# Patient Record
Sex: Female | Born: 1950
Health system: Southern US, Community
[De-identification: ages and names within clinical notes are randomized; demographics above are authoritative.]

## PROBLEM LIST (undated history)

## (undated) DIAGNOSIS — J45909 Unspecified asthma, uncomplicated: Secondary | ICD-10-CM

## (undated) DIAGNOSIS — E785 Hyperlipidemia, unspecified: Secondary | ICD-10-CM

## (undated) DIAGNOSIS — M8580 Other specified disorders of bone density and structure, unspecified site: Secondary | ICD-10-CM

## (undated) DIAGNOSIS — Z8619 Personal history of other infectious and parasitic diseases: Secondary | ICD-10-CM

## (undated) DIAGNOSIS — G4733 Obstructive sleep apnea (adult) (pediatric): Secondary | ICD-10-CM

## (undated) DIAGNOSIS — T7840XA Allergy, unspecified, initial encounter: Secondary | ICD-10-CM

## (undated) DIAGNOSIS — G43909 Migraine, unspecified, not intractable, without status migrainosus: Secondary | ICD-10-CM

## (undated) HISTORY — PX: EYE SURGERY: SHX253

## (undated) HISTORY — DX: Obstructive sleep apnea (adult) (pediatric): G47.33

## (undated) HISTORY — DX: Personal history of other infectious and parasitic diseases: Z86.19

## (undated) HISTORY — DX: Unspecified asthma, uncomplicated: J45.909

## (undated) HISTORY — DX: Other specified disorders of bone density and structure, unspecified site: M85.80

## (undated) HISTORY — DX: Hyperlipidemia, unspecified: E78.5

## (undated) HISTORY — DX: Allergy, unspecified, initial encounter: T78.40XA

## (undated) HISTORY — DX: Migraine, unspecified, not intractable, without status migrainosus: G43.909

---

## 2005-04-07 HISTORY — PX: KNEE SURGERY: SHX244

## 2007-01-20 LAB — HM COLONOSCOPY

## 2009-05-30 ENCOUNTER — Other Ambulatory Visit: Admission: RE | Admit: 2009-05-30 | Discharge: 2009-05-30 | Payer: Self-pay | Admitting: Family Medicine

## 2010-08-05 ENCOUNTER — Other Ambulatory Visit (HOSPITAL_COMMUNITY)
Admission: RE | Admit: 2010-08-05 | Discharge: 2010-08-05 | Disposition: A | Discharge status: Home / Self Care | Payer: BC Managed Care – PPO | Source: Ambulatory Visit | Attending: Family Medicine | Admitting: Family Medicine

## 2010-08-05 ENCOUNTER — Other Ambulatory Visit: Payer: Self-pay | Admitting: Family Medicine

## 2010-08-05 DIAGNOSIS — Z01419 Encounter for gynecological examination (general) (routine) without abnormal findings: Secondary | ICD-10-CM | POA: Insufficient documentation

## 2011-07-31 ENCOUNTER — Other Ambulatory Visit: Payer: Self-pay | Admitting: Dermatology

## 2012-09-10 ENCOUNTER — Other Ambulatory Visit: Payer: Self-pay | Admitting: Family Medicine

## 2012-09-10 ENCOUNTER — Other Ambulatory Visit (HOSPITAL_COMMUNITY)
Admission: RE | Admit: 2012-09-10 | Discharge: 2012-09-10 | Disposition: A | Discharge status: Home / Self Care | Payer: BC Managed Care – PPO | Source: Ambulatory Visit | Attending: Family Medicine | Admitting: Family Medicine

## 2012-09-10 DIAGNOSIS — Z01419 Encounter for gynecological examination (general) (routine) without abnormal findings: Secondary | ICD-10-CM | POA: Insufficient documentation

## 2013-04-07 LAB — HM PAP SMEAR: HM PAP: NORMAL

## 2014-03-10 DIAGNOSIS — H6983 Other specified disorders of Eustachian tube, bilateral: Secondary | ICD-10-CM | POA: Insufficient documentation

## 2014-03-29 DIAGNOSIS — E785 Hyperlipidemia, unspecified: Secondary | ICD-10-CM | POA: Insufficient documentation

## 2015-12-26 DIAGNOSIS — R42 Dizziness and giddiness: Secondary | ICD-10-CM | POA: Insufficient documentation

## 2016-10-03 DIAGNOSIS — I4510 Unspecified right bundle-branch block: Secondary | ICD-10-CM | POA: Insufficient documentation

## 2016-10-09 LAB — HM DEXA SCAN

## 2016-10-09 LAB — HM MAMMOGRAPHY: HM MAMMO: NORMAL (ref 0–4)

## 2016-10-14 DIAGNOSIS — M8580 Other specified disorders of bone density and structure, unspecified site: Secondary | ICD-10-CM | POA: Insufficient documentation

## 2016-11-10 ENCOUNTER — Telehealth: Payer: Self-pay

## 2016-11-10 NOTE — Telephone Encounter (Signed)
SENT NOTES TO SCHEDULING 

## 2016-12-05 ENCOUNTER — Telehealth: Payer: Self-pay | Admitting: Cardiology

## 2016-12-05 NOTE — Telephone Encounter (Signed)
Received incoming records from [facility] for upcoming appointment on 12/26/16 @ 11:00am with Dr. Percival Spanish. Records given to [facility] in Medical Records. 12/05/16/ab

## 2016-12-10 ENCOUNTER — Telehealth: Payer: Self-pay | Admitting: *Deleted

## 2016-12-10 NOTE — Telephone Encounter (Signed)
Made pt aware

## 2016-12-10 NOTE — Telephone Encounter (Signed)
Pt would like to make provider aware that she had her Medicare wellness visit on 08/11/16. Pt is due to establish care with provider on 12/29/16.

## 2016-12-10 NOTE — Telephone Encounter (Signed)
Ok, can you ask pt to bring any previous health records / results with her to her upcoming appt with Melissa?

## 2016-12-25 NOTE — Progress Notes (Signed)
Cardiology Office Note   Date:  12/26/2016   ID:  Anna Peterson, DOB April 16, 1950, MRN [id number]  PCP:  Rich Fuchs, PA  Cardiologist:   Minus Breeding, MD  Referring:  Rich Fuchs, PA  Chief Complaint  Patient presents with  . Abnormal ECG      History of Present Illness: Anna Peterson is a 66 y.o. female who is referred by Rich Fuchs, PA for evaluation of an abnormal EKG.  She has an incomplete RBBB.  She has no past cardiac history.  The patient denies any new symptoms such as chest discomfort, neck or arm discomfort. There has been no new shortness of breath, PND or orthopnea. There have been no reported palpitations, presyncope or syncope.  She's had no reason for past testing. She's not sure she's had a previous EKG that demonstrates the same thing as she is originally from [place].    The abnormal EKG was noted on routine testing.   Past Medical History:  Diagnosis Date  . Allergy   . Asthma   . Hyperlipidemia   . Osteoporosis    Osteopenia per Dexa Scan    Past Surgical History:  Procedure Laterality Date  . CESAREAN SECTION    . KNEE SURGERY       Current Outpatient Prescriptions  Medication Sig Dispense Refill  . albuterol (PROVENTIL HFA;VENTOLIN HFA) 108 (90 Base) MCG/ACT inhaler Inhale 2 puffs into the lungs every 6 (six) hours as needed for wheezing or shortness of breath.    . Coenzyme Q10 (CO Q-10) 120 MG CAPS Take 120 mg by mouth 2 (two) times daily.    . fluticasone (FLONASE) 50 MCG/ACT nasal spray Place 1 spray into both nostrils daily as needed for allergies or rhinitis.    . Garlic 6759 MG CAPS Take 1 capsule by mouth daily.    Marland Kitchen loratadine (CLARITIN) 10 MG tablet Take 10 mg by mouth daily as needed for allergies.    . Multiple Vitamins-Minerals (MULTIVITAL PO) Take 1 tablet by mouth daily.    . Omega-3 Fatty Acids (FISH OIL) 1200 MG CAPS Take 1,200 mg by mouth 2 (two) times daily.    . Pseudoephedrine-Naproxen Na (SUDAFED  PRESSURE+PAIN 12 HR PO) Take 1 capsule by mouth as needed.    . Red Yeast Rice Extract (RED YEAST RICE PO) Take 1,200 mg by mouth 2 (two) times daily.    . SUMAtriptan (IMITREX) 100 MG tablet Take 100 mg by mouth every 2 (two) hours as needed for migraine. May repeat in 2 hours if headache persists or recurs.     No current facility-administered medications for this visit.     Allergies:   Other; Ceclor [cefaclor]; and Latex    Social History:  The patient  reports that she quit smoking about 46 years ago. Her smoking use included Cigarettes. She has never used smokeless tobacco.   Family History:  The patient's family history includes Aneurysm in her mother; Breast cancer in her paternal grandmother; Pancreatic cancer in her maternal grandmother; Stroke in her maternal grandfather.    ROS:  Please see the history of present illness.   Otherwise, review of systems are positive for none.   All other systems are reviewed and negative.    PHYSICAL EXAM: VS:  BP (!) 132/92   Pulse 68   Ht 5' 2.5" (1.588 m)   Wt 157 lb (71.2 kg)   BMI 28.26 kg/m  , BMI Body mass index is 28.26  kg/m. GENERAL:  Well appearing HEENT:  Pupils equal round and reactive, fundi not visualized, oral mucosa unremarkable NECK:  No jugular venous distention, waveform within normal limits, carotid upstroke brisk and symmetric, no bruits, no thyromegaly LYMPHATICS:  No cervical, inguinal adenopathy LUNGS:  Clear to auscultation bilaterally BACK:  No CVA tenderness CHEST:  Unremarkable HEART:  PMI not displaced or sustained,S1 and S2 within normal limits, no S3, no S4, no clicks, no rubs, no murmurs ABD:  Flat, positive bowel sounds normal in frequency in pitch, no bruits, no rebound, no guarding, no midline pulsatile mass, no hepatomegaly, no splenomegaly EXT:  2 plus pulses throughout, no edema, no cyanosis no clubbing SKIN:  No rashes no nodules NEURO:  Cranial nerves II through XII grossly intact, motor grossly  intact throughout PSYCH:  Cognitively intact, oriented to person place and time    EKG:  EKG is ordered today. The ekg ordered today demonstrates sinus rhythm, rate 68, left axis deviation, early transition 2, RSR prime V1, nonspecific anterior T-wave changes.   Recent Labs: No results found for requested labs within last 8760 hours.    Lipid Panel No results found for: CHOL, TRIG, HDL, CHOLHDL, VLDL, LDLCALC, LDLDIRECT    Wt Readings from Last 3 Encounters:  12/26/16 157 lb (71.2 kg)      Other studies Reviewed: Additional studies/ records that were reviewed today include: Labs. Review of the above records demonstrates:  Please see elsewhere in the note.     ASSESSMENT AND PLAN:    ABNORMAL EKG:  She has a mildly abnormal EKG without any suggestion of structural heart disease or coronary artery disease. At this point no further cardiovascular testing is suggested. She has a low cardiovascular risk factor for anemia. Approximately 2%.  DYSLIPIDEMIA:   She does have an LDL of 166 but by current criteria would not need statin therapy. We discussed lifestyle and exercise.    Current medicines are reviewed at length with the patient today.  The patient does not have concerns regarding medicines.  The following changes have been made:  no change  Labs/ tests ordered today include: None  Orders Placed This Encounter  Procedures  . EKG 12-Lead     Disposition:   FU with me as needed.      Signed, Minus Breeding, MD  12/26/2016 12:23 PM    [facility]

## 2016-12-26 ENCOUNTER — Encounter: Payer: Self-pay | Admitting: Cardiology

## 2016-12-26 ENCOUNTER — Telehealth: Payer: Self-pay

## 2016-12-26 ENCOUNTER — Ambulatory Visit (INDEPENDENT_AMBULATORY_CARE_PROVIDER_SITE_OTHER): Payer: Medicare Other | Admitting: Cardiology

## 2016-12-26 VITALS — BP 132/92 | HR 68 | Ht 62.5 in | Wt 157.0 lb

## 2016-12-26 DIAGNOSIS — I4510 Unspecified right bundle-branch block: Secondary | ICD-10-CM

## 2016-12-26 NOTE — Telephone Encounter (Signed)
Pre visit call completed 

## 2016-12-26 NOTE — Patient Instructions (Signed)
Your physician recommends that you schedule a follow-up appointment in: As Needed    

## 2016-12-29 ENCOUNTER — Ambulatory Visit (INDEPENDENT_AMBULATORY_CARE_PROVIDER_SITE_OTHER): Payer: Medicare Other | Admitting: Family

## 2016-12-29 ENCOUNTER — Encounter: Payer: Self-pay | Admitting: Family

## 2016-12-29 VITALS — BP 122/88 | HR 69 | Temp 98.6°F | Resp 16 | Ht 62.5 in | Wt 157.4 lb

## 2016-12-29 DIAGNOSIS — M25561 Pain in right knee: Secondary | ICD-10-CM | POA: Insufficient documentation

## 2016-12-29 DIAGNOSIS — M8580 Other specified disorders of bone density and structure, unspecified site: Secondary | ICD-10-CM | POA: Diagnosis not present

## 2016-12-29 DIAGNOSIS — J45909 Unspecified asthma, uncomplicated: Secondary | ICD-10-CM | POA: Diagnosis not present

## 2016-12-29 DIAGNOSIS — B351 Tinea unguium: Secondary | ICD-10-CM

## 2016-12-29 DIAGNOSIS — E785 Hyperlipidemia, unspecified: Secondary | ICD-10-CM | POA: Diagnosis not present

## 2016-12-29 DIAGNOSIS — G8929 Other chronic pain: Secondary | ICD-10-CM

## 2016-12-29 DIAGNOSIS — I4510 Unspecified right bundle-branch block: Secondary | ICD-10-CM | POA: Diagnosis not present

## 2016-12-29 LAB — LIPID PANEL
CHOLESTEROL: 225 mg/dL — AB (ref 0–200)
HDL: 54.1 mg/dL (ref >39.00)
LDL CALC: 154 mg/dL — AB (ref 0–99)
NonHDL: 170.54
Total CHOL/HDL Ratio: 4
Triglycerides: 84 mg/dL (ref 0.0–149.0)
VLDL: 16.8 mg/dL (ref 0.0–40.0)

## 2016-12-29 NOTE — Assessment & Plan Note (Signed)
Declines statin therapy, she wishes to have lipid panel checked today.

## 2016-12-29 NOTE — Assessment & Plan Note (Signed)
Discussed oral lamisil, she declines at this time.

## 2016-12-29 NOTE — Assessment & Plan Note (Signed)
She is maintained on a calcium supplemetn.

## 2016-12-29 NOTE — Assessment & Plan Note (Signed)
Sounds like mild OA. She does not wish to pursue aggressive work up/treatment. Advised pt as follows:   For knee pain you may use tylenol as needed and meloxicam sparingly for severe pain. Let me know if pain worsens and I will refer you to orthopedics.

## 2016-12-29 NOTE — Patient Instructions (Addendum)
Please complete lab work prior to leaving. For knee pain you may use tylenol as needed and meloxicam sparingly for severe pain. Let me know if pain worsens and I will refer you to orthopedics. For toenail fungus, please let me know if you would like to try oral lamisil in the future.

## 2016-12-29 NOTE — Assessment & Plan Note (Signed)
Has seasonal symptoms. Currently stable. Monitor.

## 2016-12-29 NOTE — Assessment & Plan Note (Signed)
No further work up planned per cardiology.

## 2016-12-29 NOTE — Progress Notes (Signed)
Subjective:    Patient ID: Anna Peterson, female    DOB: [birth year], 66 y.o.   MRN: [id number]  HPI   Ms.   Patient is a 66 year old female who presents today to establish care. She has several concerns:  #1 knee pain-she reports intermittent right knee pain which began after a flight to [place] in mid August of this year.  Reports that she has intermittent pain, no pain today.  Worse with walking up stairs.  Denies Calf pain/swelling or shortness of breath.    #2 skin lesion-she reports possible skin tag that irritates her bra strap.  #3 toenail problem-she is concerned that she may have a toenail fungus. She also reports a sore area on her left fourth toe. Has had an ulcer there in the past.  Past medical history is significant for the following:  Osteopenia-she had a bone density performed August of this year.  History of right bundle branch block- saw Dr. Percival Spanish and  Migraine-has history of migraine. Has seen ENT in the past and was diagnosed with vestibular migraine. She reports that migraines are often related to the weather and barometric pressure.  Cabbage is a trigger.  Typically uses sudafed, ibuprofen and tylenol. Usually helps and if not, uses imitrex which she rarely uses.    History of asthma- reports that that is usually allergy related.  Uses albuterol prn which helps.    Hyperlipidemia-not on statin, declines to take one.  Last lipid panel 2015.   Review of Systems  Constitutional: Negative for unexpected weight change.  HENT: Negative for hearing loss and rhinorrhea.   Eyes: Negative for visual disturbance.  Respiratory: Negative for cough.   Cardiovascular: Negative for leg swelling.  Gastrointestinal: Negative for blood in stool, diarrhea and nausea.  Genitourinary: Negative for dysuria and hematuria.  Musculoskeletal:       Intermittent right knee pain  Skin: Negative for rash.  Neurological: Positive for headaches.  Hematological: Negative for  adenopathy.  Psychiatric/Behavioral:       Denies depression/anxiety       Past Medical History:  Diagnosis Date  . Allergy   . Asthma   . History of chicken pox   . Hyperlipidemia   . Migraine   . Osteoporosis    Osteopenia per Dexa Scan     Social History   Social History  . Marital status: Married    Spouse name: N/A  . Number of children: N/A  . Years of education: N/A   Occupational History  . Not on file.   Social History Main Topics  . Smoking status: Former Smoker    Types: Cigarettes    Quit date: 12/06/1970  . Smokeless tobacco: Never Used  . Alcohol use 3.0 oz/week    5 Glasses of wine per week  . Drug use: No  . Sexual activity: Not on file   Other Topics Concern  . 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  Live in 4 different states.  Oldest son Corene Cornea lives locally. Second son Shanon Brow- lives in [place], daughter lives in [place], [place] lives on [address]   No pets   Married for 43 years.      Past Surgical History:  Procedure Laterality  Date  . CESAREAN SECTION    . KNEE SURGERY Left 2007   meniscus repair    Family History  Problem Relation Age of Onset  . Aneurysm Mother 44       Cerebral age 24  . Pancreatic cancer Maternal Grandmother   . Arthritis Maternal Grandmother   . Stroke Maternal Grandfather 9  . Arthritis Maternal Grandfather   . Breast cancer Paternal Grandmother 53  . Arthritis Paternal Grandmother   . Arthritis Paternal Grandfather     Allergies  Allergen Reactions  . Cabbage     "Migraine"  . Other Swelling    Peaches  . Ceclor [Cefaclor] Rash  . Latex Rash    Current Outpatient Prescriptions on File Prior to Visit  Medication Sig Dispense Refill  . albuterol (PROVENTIL HFA;VENTOLIN HFA) 108  (90 Base) MCG/ACT inhaler Inhale 2 puffs into the lungs every 6 (six) hours as needed for wheezing or shortness of breath.    . Coenzyme Q10 (CO Q-10) 120 MG CAPS Take 120 mg by mouth 2 (two) times daily.    . fluticasone (FLONASE) 50 MCG/ACT nasal spray Place 1 spray into both nostrils daily as needed for allergies or rhinitis.    . Garlic 2725 MG CAPS Take 1 capsule by mouth daily.    Marland Kitchen loratadine (CLARITIN) 10 MG tablet Take 10 mg by mouth daily as needed for allergies.    . Multiple Vitamins-Minerals (MULTIVITAL PO) Take 1 tablet by mouth daily. MNS3. (metabolic nutrition system)    . Omega-3 Fatty Acids (FISH OIL) 1200 MG CAPS Take 1,200 mg by mouth 2 (two) times daily.    . Pseudoephedrine-Naproxen Na (SUDAFED PRESSURE+PAIN 12 HR PO) Take 1 capsule by mouth as needed.    . SUMAtriptan (IMITREX) 100 MG tablet Take 100 mg by mouth every 2 (two) hours as needed for migraine. May repeat in 2 hours if headache persists or recurs.     No current facility-administered medications on file prior to visit.     BP 122/88 (BP Location: Right Arm, Cuff Size: Normal)   Pulse 69   Temp 98.6 F (37 C) (Oral)   Resp 16   Ht 5' 2.5" (1.588 m)   Wt 157 lb 6.4 oz (71.4 kg)   LMP 04/07/1997   SpO2 99%   BMI 28.33 kg/m    Objective:   Physical Exam  Constitutional: She is oriented to person, place, and time. She appears well-developed and well-nourished.  HENT:  Head: Normocephalic and atraumatic.  Right Ear: Tympanic membrane and ear canal normal.  Left Ear: Tympanic membrane and ear canal normal.  Mouth/Throat: No oropharyngeal exudate, posterior oropharyngeal edema or posterior oropharyngeal erythema.  Eyes: No scleral icterus.  Cardiovascular: Normal rate, regular rhythm and normal heart sounds.   No murmur heard. Pulmonary/Chest: Effort normal and breath sounds normal. No respiratory distress. She has no wheezes.  Abdominal: Soft. Bowel sounds are normal.  Musculoskeletal: She exhibits no  edema.  Right knee without swelling/tenderness  Lymphadenopathy:    She has no cervical adenopathy.  Neurological: She is alert and oriented to person, place, and time.  Skin: Skin is warm and dry.  Thickened toenails bilaterally. Very small skin tag left anterior chest wall.   Psychiatric: She has a normal mood and affect. Her behavior is normal. Judgment and thought content normal.          Assessment & Plan:

## 2016-12-30 ENCOUNTER — Encounter: Payer: Self-pay | Admitting: Family

## 2017-01-02 ENCOUNTER — Telehealth: Payer: Self-pay | Admitting: Family

## 2017-01-02 NOTE — Telephone Encounter (Signed)
Relation to HM:CNOB Call back [phone]   Reason for call:  Patient inquiring about lab results, please advise

## 2017-01-02 NOTE — Telephone Encounter (Signed)
Pt aware of lab results/also advised her that a letter was mailed/thx dmf

## 2017-03-10 ENCOUNTER — Other Ambulatory Visit: Payer: Self-pay | Admitting: Family

## 2017-06-26 ENCOUNTER — Ambulatory Visit (INDEPENDENT_AMBULATORY_CARE_PROVIDER_SITE_OTHER): Payer: Medicare Other | Admitting: Family

## 2017-06-26 ENCOUNTER — Encounter: Payer: Self-pay | Admitting: Family

## 2017-06-26 VITALS — BP 144/94 | HR 73 | Temp 97.8°F | Resp 16 | Ht 62.5 in | Wt 156.0 lb

## 2017-06-26 DIAGNOSIS — G8929 Other chronic pain: Secondary | ICD-10-CM

## 2017-06-26 DIAGNOSIS — M25561 Pain in right knee: Secondary | ICD-10-CM

## 2017-06-26 DIAGNOSIS — R030 Elevated blood-pressure reading, without diagnosis of hypertension: Secondary | ICD-10-CM

## 2017-06-26 NOTE — Patient Instructions (Signed)
You will be contacted about your referral to the orthopedist.

## 2017-06-26 NOTE — Progress Notes (Signed)
Subjective:    Patient ID: Anna Peterson, female    DOB: March 03, 1951, 67 y.o.   MRN: [id number]  HPI  Patient is a 67 yr old female who presents today to discuss right knee pain. Took meloxicam the last 2 nights. This helped some.  Reports that pain has been waking her up. Pain is a dull ache.   Review of Systems See HPI  Past Medical History:  Diagnosis Date  . Allergy   . Asthma   . History of chicken pox   . Hyperlipidemia   . Migraine   . Osteopenia    Osteopenia per Dexa Scan     Social History   Socioeconomic History  . Marital status: Married    Spouse name: Not on file  . Number of children: Not on file  . Years of education: Not on file  . Highest education level: Not on file  Occupational History  . Not on file  Social Needs  . Financial resource strain: Not on file  . Food insecurity:    Worry: Not on file    Inability: Not on file  . Transportation needs:    Medical: Not on file    Non-medical: Not on file  Tobacco Use  . Smoking status: Former Smoker    Types: Cigarettes    Last attempt to quit: 12/06/1970    Years since quitting: 46.5  . Smokeless tobacco: Never Used  Substance and Sexual Activity  . Alcohol use: Yes    Alcohol/week: 3.0 oz    Types: 5 Glasses of wine per week  . Drug use: No  . Sexual activity: Not on file  Lifestyle  . Physical activity:    Days per week: Not on file    Minutes per session: Not on file  . Stress: Not on file  Relationships  . Social connections:    Talks on phone: Not on file    Gets together: Not on file    Attends religious service: Not on file    Active member of club or organization: Not on file    Attends meetings of clubs or organizations: Not on file    Relationship status: Not on file  . Intimate partner violence:    Fear of current or ex partner: Not on file    Emotionally abused: Not on file    Physically abused: Not on file    Forced sexual activity: Not on file  Other Topics Concern  .  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  Live in 4 different states.  Oldest son Corene Cornea lives locally. Second son Shanon Brow- lives in [place], daughter lives in [place], [place] lives on [address]   No pets   Married for 43 years.      Past Surgical History:  Procedure Laterality Date  . CESAREAN SECTION    . KNEE SURGERY Left 2007   meniscus repair    Family History  Problem Relation Age of Onset  . Aneurysm Mother 64       Cerebral age 7  . Pancreatic cancer Maternal Grandmother   . Arthritis Maternal Grandmother   . Stroke Maternal Grandfather 47  . Arthritis Maternal Grandfather   . Breast cancer Paternal Grandmother  33  . Arthritis Paternal Grandmother   . Arthritis Paternal Grandfather     Allergies  Allergen Reactions  . Cabbage     "Migraine"  . Other Swelling    Peaches  . Ceclor [Cefaclor] Rash  . Latex Rash    Current Outpatient Medications on File Prior to Visit  Medication Sig Dispense Refill  . albuterol (PROVENTIL HFA;VENTOLIN HFA) 108 (90 Base) MCG/ACT inhaler INHALE TWO PUFFS INTO THE LUNGS EVERY 6 (SIX) HOURS AS NEEDED FOR WHEEZING. 8.5 Inhaler 2  . fluticasone (FLONASE) 50 MCG/ACT nasal spray Place 1 spray into both nostrils daily as needed for allergies or rhinitis.    . Garlic 1791 MG CAPS Take 1 capsule by mouth daily.    Marland Kitchen loratadine (CLARITIN) 10 MG tablet Take 10 mg by mouth daily as needed for allergies.    . Multiple Vitamins-Minerals (MULTIVITAL PO) Take 1 tablet by mouth daily. MNS3. (metabolic nutrition system)    . Omega-3 Fatty Acids (FISH OIL) 1200 MG CAPS Take 1,200 mg by mouth 2 (two) times daily.    . Pseudoephedrine-Naproxen Na (SUDAFED PRESSURE+PAIN 12 HR PO) Take 1 capsule by mouth as needed.    . SUMAtriptan (IMITREX) 100  MG tablet Take 100 mg by mouth every 2 (two) hours as needed for migraine. May repeat in 2 hours if headache persists or recurs.     No current facility-administered medications on file prior to visit.     BP (!) 144/94 (BP Location: Right Arm, Patient Position: Sitting, Cuff Size: Small)   Pulse 73   Temp 97.8 F (36.6 C) (Oral)   Resp 16   Ht 5' 2.5" (1.588 m)   Wt 156 lb (70.8 kg)   LMP 04/07/1997   SpO2 100%   BMI 28.08 kg/m       Objective:   Physical Exam  Constitutional: She is oriented to person, place, and time. She appears well-developed and well-nourished.  Cardiovascular: Normal rate, regular rhythm and normal heart sounds.  No murmur heard. Pulmonary/Chest: Effort normal and breath sounds normal. No respiratory distress. She has no wheezes.  Musculoskeletal:  Bilateral knees without swelling.  Bilateral crepitus is noted R>L.   Neurological: She is alert and oriented to person, place, and time.  Psychiatric: She has a normal mood and affect. Her behavior is normal. Judgment and thought content normal.          Assessment & Plan:  Right knee pain- continue prn meloxicam, refer to orthopedics for further evaluation.   Elevated blood pressure reading- repeat BP 142/90.  Plan to repeat bp in 3 months.   BP Readings from Last 3 Encounters:  06/26/17 (!) 144/94  12/29/16 122/88  12/26/16 (!) 132/92

## 2017-09-28 ENCOUNTER — Encounter: Payer: Self-pay | Admitting: Family

## 2017-09-28 ENCOUNTER — Ambulatory Visit (INDEPENDENT_AMBULATORY_CARE_PROVIDER_SITE_OTHER): Payer: Medicare Other | Admitting: Family

## 2017-09-28 ENCOUNTER — Other Ambulatory Visit (HOSPITAL_COMMUNITY)
Admission: RE | Admit: 2017-09-28 | Discharge: 2017-09-28 | Disposition: A | Discharge status: Home / Self Care | Payer: Medicare Other | Source: Ambulatory Visit | Attending: Family | Admitting: Family

## 2017-09-28 VITALS — BP 126/67 | HR 73 | Temp 98.4°F | Resp 16 | Ht 62.5 in | Wt 148.2 lb

## 2017-09-28 DIAGNOSIS — E785 Hyperlipidemia, unspecified: Secondary | ICD-10-CM | POA: Diagnosis not present

## 2017-09-28 DIAGNOSIS — Z0000 Encounter for general adult medical examination without abnormal findings: Secondary | ICD-10-CM | POA: Diagnosis not present

## 2017-09-28 DIAGNOSIS — Z01419 Encounter for gynecological examination (general) (routine) without abnormal findings: Secondary | ICD-10-CM | POA: Insufficient documentation

## 2017-09-28 DIAGNOSIS — G4700 Insomnia, unspecified: Secondary | ICD-10-CM

## 2017-09-28 DIAGNOSIS — R400 Somnolence: Secondary | ICD-10-CM | POA: Diagnosis not present

## 2017-09-28 LAB — COMPREHENSIVE METABOLIC PANEL
ALT: 24 U/L (ref 0–35)
AST: 21 U/L (ref 0–37)
Albumin: 4.4 g/dL (ref 3.5–5.2)
Alkaline Phosphatase: 54 U/L (ref 39–117)
BUN: 14 mg/dL (ref 6–23)
CO2: 27 meq/L (ref 19–32)
Calcium: 9.8 mg/dL (ref 8.4–10.5)
Chloride: 104 mEq/L (ref 96–112)
Creatinine, Ser: 0.74 mg/dL (ref 0.40–1.20)
GFR: 83.41 mL/min (ref >60.00)
GLUCOSE: 98 mg/dL (ref 70–99)
Potassium: 4.1 mEq/L (ref 3.5–5.1)
Sodium: 139 mEq/L (ref 135–145)
TOTAL PROTEIN: 6.9 g/dL (ref 6.0–8.3)
Total Bilirubin: 0.6 mg/dL (ref 0.2–1.2)

## 2017-09-28 LAB — LIPID PANEL
CHOLESTEROL: 236 mg/dL — AB (ref 0–200)
HDL: 55.5 mg/dL (ref >39.00)
LDL CALC: 164 mg/dL — AB (ref 0–99)
NonHDL: 180.21
TRIGLYCERIDES: 83 mg/dL (ref 0.0–149.0)
Total CHOL/HDL Ratio: 4
VLDL: 16.6 mg/dL (ref 0.0–40.0)

## 2017-09-28 NOTE — Patient Instructions (Signed)
Please complete lab work prior to leaving.   

## 2017-09-28 NOTE — Progress Notes (Signed)
Subjective:    Patient ID: Anna Peterson, female    DOB: [birth year], 67 y.o.   MRN: [id number]  HPI  Patient presents today for complete physical.  Immunizations: declines pneumovax, tetanus up to date Diet: healthy Exercise:  Some walking, limited by knee pain Colonoscopy:  2018 Dexa:  7/18 Pap Smear: due Mammogram: 10/09/16 Vision: 08/05/17 Dental: up to date Wt Readings from Last 3 Encounters:  09/28/17 148 lb 3.2 oz (67.2 kg)  06/26/17 156 lb (70.8 kg)  12/29/16 157 lb 6.4 oz (71.4 kg)    Reports that she wakes up frequently during the night. + snoring, often wakes up unrested.  Insomnia- reports some trouble falling asleep and some trouble staying asleep. Reports + snoring and also some daytime somnolence. Concerned about the possibility of OSA.  Review of Systems  HENT: Negative for rhinorrhea.   Eyes: Negative for visual disturbance.  Respiratory: Negative for cough and shortness of breath.   Cardiovascular: Negative for chest pain and leg swelling.  Gastrointestinal: Negative for blood in stool, constipation and diarrhea.  Genitourinary: Negative for dysuria, frequency and hematuria.  Musculoskeletal: Negative for myalgias.       Some knee pain- sees ortho   Skin: Negative for rash.  Neurological: Positive for headaches.       Reports "barometric headaches" if the weather is going to change. Uses sudafed, ibuprofen, rarely needs to use imitrex  Hematological: Negative for adenopathy.  Psychiatric/Behavioral:       Denies depression/anxiety       Past Medical History:  Diagnosis Date  . Allergy   . Asthma   . History of chicken pox   . Hyperlipidemia   . Migraine   . Osteopenia    Osteopenia per Dexa Scan     Social History   Socioeconomic History  . Marital status: Married    Spouse name: Not on file  . Number of children: Not on file  . Years of education: Not on file  . Highest education level: Not on file  Occupational History  . Not on file    Social Needs  . Financial resource strain: Not on file  . Food insecurity:    Worry: Not on file    Inability: Not on file  . Transportation needs:    Medical: Not on file    Non-medical: Not on file  Tobacco Use  . Smoking status: Former Smoker    Types: Cigarettes    Last attempt to quit: 12/06/1970    Years since quitting: 46.8  . Smokeless tobacco: Never Used  Substance and Sexual Activity  . Alcohol use: Yes    Alcohol/week: 3.0 oz    Types: 5 Glasses of wine per week  . Drug use: No  . Sexual activity: Not on file  Lifestyle  . Physical activity:    Days per week: Not on file    Minutes per session: Not on file  . Stress: Not on file  Relationships  . Social connections:    Talks on phone: Not on file    Gets together: Not on file    Attends religious service: Not on file    Active member of club or organization: Not on file    Attends meetings of clubs or organizations: Not on file    Relationship status: Not on file  . Intimate partner violence:    Fear of current or ex partner: Not on file    Emotionally abused: Not on file  Physically abused: Not on file    Forced sexual activity: Not on file  Other Topics Concern  . 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  Live in 4 different states.  Oldest son Corene Cornea lives locally. Second son Shanon Brow- lives in [place], daughter lives in [place], [place] lives on [address]   No pets   Married for 43 years.      Past Surgical History:  Procedure Laterality Date  . CESAREAN SECTION    . KNEE SURGERY Left 2007   meniscus repair    Family History  Problem Relation Age of Onset  . Aneurysm Mother 74       Cerebral age 79  . Pancreatic cancer Maternal Grandmother   . Arthritis Maternal Grandmother   .  Stroke Maternal Grandfather 48  . Arthritis Maternal Grandfather   . Breast cancer Paternal Grandmother 35  . Arthritis Paternal Grandmother   . Arthritis Paternal Grandfather     Allergies  Allergen Reactions  . Cabbage     "Migraine"  . Other Swelling    Peaches  . Ceclor [Cefaclor] Rash  . Latex Rash    Current Outpatient Medications on File Prior to Visit  Medication Sig Dispense Refill  . albuterol (PROVENTIL HFA;VENTOLIN HFA) 108 (90 Base) MCG/ACT inhaler INHALE TWO PUFFS INTO THE LUNGS EVERY 6 (SIX) HOURS AS NEEDED FOR WHEEZING. 8.5 Inhaler 2  . fluticasone (FLONASE) 50 MCG/ACT nasal spray Place 1 spray into both nostrils daily as needed for allergies or rhinitis.    . Garlic 9476 MG CAPS Take 1 capsule by mouth daily.    Marland Kitchen loratadine (CLARITIN) 10 MG tablet Take 10 mg by mouth daily as needed for allergies.    . Multiple Vitamins-Minerals (MULTIVITAL PO) Take 1 tablet by mouth daily. MNS3. (metabolic nutrition system)    . Omega-3 Fatty Acids (FISH OIL) 1200 MG CAPS Take 1,200 mg by mouth 2 (two) times daily.    . SUMAtriptan (IMITREX) 100 MG tablet Take 100 mg by mouth every 2 (two) hours as needed for migraine. May repeat in 2 hours if headache persists or recurs.     No current facility-administered medications on file prior to visit.     BP 126/67 (BP Location: Right Arm, Patient Position: Sitting, Cuff Size: Small)   Pulse 73   Temp 98.4 F (36.9 C) (Oral)   Resp 16   Ht 5' 2.5" (1.588 m)   Wt 148 lb 3.2 oz (67.2 kg)   LMP 04/07/1997   SpO2 97%   BMI 26.67 kg/m    Objective:   Physical Exam  Physical Exam  Constitutional: She is oriented to person, place, and time. She appears well-developed and well-nourished. No distress.  HENT:  Head: Normocephalic and atraumatic.  Right Ear: Tympanic membrane and ear canal normal.  Left Ear: Tympanic membrane and ear canal normal.  Mouth/Throat: Oropharynx is clear and moist.  Eyes: Pupils are equal, round, and  reactive to light. No scleral icterus.  Neck: Normal range of motion. No thyromegaly present.  Cardiovascular: Normal rate and regular rhythm.   No murmur heard. Pulmonary/Chest: Effort normal and breath sounds normal. No respiratory distress. He has no wheezes. She has no rales. She exhibits no tenderness.  Abdominal: Soft. Bowel sounds are normal. She exhibits no distension and no mass. There is no tenderness. There is no rebound and no guarding.  Musculoskeletal: She exhibits no edema.  Lymphadenopathy:    She has no cervical adenopathy.  Neurological: She is alert and oriented to person, place, and time. She has normal patellar reflexes. She exhibits normal muscle tone. Coordination normal.  Skin: Skin is warm and dry.  Psychiatric: She has a normal mood and affect. Her behavior is normal. Judgment and thought content normal.  Breasts: Examined lying Right: Without masses, retractions, discharge or axillary adenopathy.  Left: Without masses, retractions, discharge or axillary adenopathy.  Inguinal/mons: Normal without inguinal adenopathy  External genitalia: Normal  BUS/Urethra/Skene's glands: Normal  Bladder: Normal  Vagina: Atrophic Cervix: Normal  Uterus: normal in size, shape and contour. Midline and mobile  Adnexa/parametria:  Rt: Without masses or tenderness.  Lt: Without masses or tenderness.  Anus and perineum: Normal            Assessment & Plan:   Preventative Care- discussed healthy diet, regular exercise.  Obtain follow up Lipid panel as well as a cmet.  Declines prevnar today, but wishes to schedule at a later date. She is a candidate for shingrix but it is on Psychologist, prison and probation services. mammo and colo up to date. Pap performed today.   Daytime somnolence- will obtain a home sleep study.  Insomnia- trial of melatonin.     Assessment & Plan:

## 2017-09-29 ENCOUNTER — Telehealth: Payer: Self-pay | Admitting: Family

## 2017-09-29 LAB — CYTOLOGY - PAP
DIAGNOSIS: NEGATIVE
HPV (WINDOPATH): NOT DETECTED

## 2017-09-29 NOTE — Telephone Encounter (Signed)
Anna Peterson -- please review results and advise?

## 2017-09-29 NOTE — Telephone Encounter (Signed)
Copied from [place] [id number]. Topic: Quick Communication - See Telephone Encounter >> Sep 29, 2017  9:19 AM Bea Graff, NT wrote: CRM for notification. See Telephone encounter for: 09/29/17. Pt would like a call to discuss her lab results.

## 2017-09-29 NOTE — Telephone Encounter (Signed)
See result note.  

## 2017-09-30 NOTE — Telephone Encounter (Signed)
Anna Peterson results to patient yesterday.

## 2017-10-19 LAB — HM MAMMOGRAPHY

## 2017-10-20 DIAGNOSIS — G4733 Obstructive sleep apnea (adult) (pediatric): Secondary | ICD-10-CM | POA: Diagnosis not present

## 2017-10-21 DIAGNOSIS — G4733 Obstructive sleep apnea (adult) (pediatric): Secondary | ICD-10-CM | POA: Diagnosis not present

## 2017-10-27 ENCOUNTER — Encounter: Payer: Self-pay | Admitting: Family

## 2017-11-03 ENCOUNTER — Telehealth: Payer: Self-pay | Admitting: *Deleted

## 2017-11-03 DIAGNOSIS — R400 Somnolence: Secondary | ICD-10-CM

## 2017-11-03 DIAGNOSIS — G4733 Obstructive sleep apnea (adult) (pediatric): Secondary | ICD-10-CM

## 2017-11-03 DIAGNOSIS — R0683 Snoring: Secondary | ICD-10-CM

## 2017-11-03 NOTE — Telephone Encounter (Signed)
Received call from pulmonology office stating home sleep study order needs to be cancelled and re-entered as future so they can release the result. Previous order cancelled and new order placed.

## 2017-11-04 ENCOUNTER — Other Ambulatory Visit: Payer: Self-pay | Admitting: *Deleted

## 2017-11-04 DIAGNOSIS — G4733 Obstructive sleep apnea (adult) (pediatric): Secondary | ICD-10-CM

## 2017-11-04 DIAGNOSIS — R0683 Snoring: Secondary | ICD-10-CM

## 2017-11-04 DIAGNOSIS — R400 Somnolence: Secondary | ICD-10-CM

## 2017-11-09 NOTE — Telephone Encounter (Signed)
Patient calling - states she did her sleep study 07/16-07/17 and returned to Pulmonary on 07/18.  Pt would like results of study.  Pt can be reached at [phone].

## 2017-11-10 ENCOUNTER — Encounter: Payer: Self-pay | Admitting: *Deleted

## 2017-11-10 DIAGNOSIS — G4733 Obstructive sleep apnea (adult) (pediatric): Secondary | ICD-10-CM

## 2017-11-10 HISTORY — DX: Obstructive sleep apnea (adult) (pediatric): G47.33

## 2017-11-10 NOTE — Telephone Encounter (Signed)
Please contact patient and let her know that I reviewed her sleep study.  Sleep study shows severe sleep apnea.  I would recommend that she have a CPAP titration study in the sleep lab so they can optimize her mask and settings for home CPAP.  Order has been placed.

## 2017-11-10 NOTE — Addendum Note (Signed)
Addended by: [name] on: 11/10/2017 01:31 PM   Modules accepted: Orders

## 2017-11-12 NOTE — Telephone Encounter (Signed)
Patient advised of results and further testing needed,.

## 2017-12-11 ENCOUNTER — Telehealth: Payer: Self-pay | Admitting: *Deleted

## 2017-12-11 NOTE — Telephone Encounter (Signed)
Advised pt that she would need office visit for evaluation before an antibiotic could be prescribed. Pt states she was disappointed that she called at 8am this morning and could not get an appt for today or Monday. I offered her the Saturday clinic at [facility] and pt states she lives in [place] and did not want to travel to [place]. I recommended [facility] in [place] and pt is agreeable. Provided pt with the office address.

## 2017-12-11 NOTE — Telephone Encounter (Signed)
Copied from [place] [id number]. Topic: Quick Communication - See Telephone Encounter >> Dec 11, 2017  3:14 PM Zakiyyah, [place] wrote: Pt is needing to talk with someone about getting a z-pac because she has a cough, ears and congestoin   She has tried to schedule appt but nothing available  [facility] [address]   Best number  [phone]

## 2017-12-12 ENCOUNTER — Encounter: Payer: Self-pay | Admitting: Emergency Medicine

## 2017-12-12 ENCOUNTER — Emergency Department
Admission: EM | Admit: 2017-12-12 | Discharge: 2017-12-12 | Disposition: A | Discharge status: Home / Self Care | Payer: Medicare Other | Source: Home / Self Care | Attending: Family Medicine | Admitting: Family Medicine

## 2017-12-12 DIAGNOSIS — R05 Cough: Secondary | ICD-10-CM

## 2017-12-12 DIAGNOSIS — J069 Acute upper respiratory infection, unspecified: Secondary | ICD-10-CM

## 2017-12-12 DIAGNOSIS — R053 Chronic cough: Secondary | ICD-10-CM

## 2017-12-12 MED ORDER — AZITHROMYCIN 250 MG PO TABS: 250.0000 mg | ORAL_TABLET | Freq: Every day | ORAL | 0 refills | Status: DC

## 2017-12-12 NOTE — ED Provider Notes (Signed)
[facility]    CSN: [id number] Arrival date & time: 12/12/17  4193     History   Chief Complaint Chief Complaint  Patient presents with  . Cough    HPI Anna Peterson is a 67 y.o. female.   HPI Anna Peterson is a 67 y.o. female presenting to UC with c/o cough and congestion for about 3 weeks. Cough is mildly productive. associated nasal congestion. She has taken Sudafed with mild relief. Pt notes when she gets like this she need azithromycin. Denies fever, chills, n/v/d. No known sick contacts.    Past Medical History:  Diagnosis Date  . Allergy   . Asthma   . History of chicken pox   . Hyperlipidemia   . Migraine   . OSA (obstructive sleep apnea) 11/10/2017   Severe per home study 7/19  . OSA (obstructive sleep apnea) 11/10/2017  . Osteopenia    Osteopenia per Dexa Scan    Patient Active Problem List   Diagnosis Date Noted  . OSA (obstructive sleep apnea) 11/10/2017  . Onychomycosis 12/29/2016  . Asthma 12/29/2016  . Right knee pain 12/29/2016  . Osteopenia 10/14/2016  . Incomplete right bundle branch block 10/03/2016  . Dizziness 12/26/2015  . Hyperlipidemia 03/29/2014  . Dysfunction of both eustachian tubes 03/10/2014    Past Surgical History:  Procedure Laterality Date  . CESAREAN SECTION    . KNEE SURGERY Left 2007   meniscus repair    OB History   None      Home Medications    Prior to Admission medications   Medication Sig Start Date End Date Taking? Authorizing Provider  albuterol (PROVENTIL HFA;VENTOLIN HFA) 108 (90 Base) MCG/ACT inhaler INHALE TWO PUFFS INTO THE LUNGS EVERY 6 (SIX) HOURS AS NEEDED FOR WHEEZING. 03/11/17   [name], NP  azithromycin (ZITHROMAX) 250 MG tablet Take 1 tablet (250 mg total) by mouth daily. Take first 2 tablets together, then 1 every day until finished. 12/12/17   Noe Gens, PA-C  fluticasone (FLONASE) 50 MCG/ACT nasal spray Place 1 spray into both nostrils daily as needed for allergies or  rhinitis.    [provider]  Garlic 7902 MG CAPS Take 1 capsule by mouth daily.    [provider]  loratadine (CLARITIN) 10 MG tablet Take 10 mg by mouth daily as needed for allergies.    [provider]  Multiple Vitamins-Minerals (MULTIVITAL PO) Take 1 tablet by mouth daily. MNS3. (metabolic nutrition system)    [provider]  Omega-3 Fatty Acids (FISH OIL) 1200 MG CAPS Take 1,200 mg by mouth 2 (two) times daily.    [provider]  SUMAtriptan (IMITREX) 100 MG tablet Take 100 mg by mouth every 2 (two) hours as needed for migraine. May repeat in 2 hours if headache persists or recurs.    [provider]    Family History Family History  Problem Relation Age of Onset  . Aneurysm Mother 34       Cerebral age 50  . Pancreatic cancer Maternal Grandmother   . Arthritis Maternal Grandmother   . Stroke Maternal Grandfather 53  . Arthritis Maternal Grandfather   . Breast cancer Paternal Grandmother 39  . Arthritis Paternal Grandmother   . Arthritis Paternal Grandfather     Social History Social History   Tobacco Use  . Smoking status: Former Smoker    Types: Cigarettes    Last attempt to quit: 12/06/1970    Years since quitting: 47.0  .  Smokeless tobacco: Never Used  Substance Use Topics  . Alcohol use: Yes    Alcohol/week: 5.0 standard drinks    Types: 5 Glasses of wine per week  . Drug use: No     Allergies   Cabbage; Other; Ceclor [cefaclor]; and Latex   Review of Systems Review of Systems  Constitutional: Negative for chills and fever.  HENT: Positive for congestion, postnasal drip and sore throat ( scratchy). Negative for ear pain, trouble swallowing and voice change.   Respiratory: Positive for cough. Negative for shortness of breath.   Cardiovascular: Negative for chest pain and palpitations.  Gastrointestinal: Negative for abdominal pain, diarrhea, nausea and vomiting.  Musculoskeletal: Negative for  arthralgias, back pain and myalgias.  Skin: Negative for rash.     Physical Exam Triage Vital Signs ED Triage Vitals  Enc Vitals Group     BP 12/12/17 0942 (!) 143/92     Pulse Rate 12/12/17 0942 74     Resp --      Temp 12/12/17 0942 (!) 97.4 F (36.3 C)     Temp Source 12/12/17 0942 Oral     SpO2 12/12/17 0942 98 %     Weight 12/12/17 0943 148 lb (67.1 kg)     Height --      Head Circumference --      Peak Flow --      Pain Score 12/12/17 0943 0     Pain Loc --      Pain Edu? --      Excl. in [place]? --    No data found.  Updated Vital Signs BP (!) 143/92 (BP Location: Right Arm)   Pulse 74   Temp (!) 97.4 F (36.3 C) (Oral)   Wt 148 lb (67.1 kg)   LMP 04/07/1997   SpO2 98%   BMI 26.64 kg/m   Visual Acuity Right Eye Distance:   Left Eye Distance:   Bilateral Distance:    Right Eye Near:   Left Eye Near:    Bilateral Near:     Physical Exam  Constitutional: She is oriented to person, place, and time. She appears well-developed and well-nourished. No distress.  HENT:  Head: Normocephalic and atraumatic.  Right Ear: Tympanic membrane normal.  Left Ear: Tympanic membrane normal.  Nose: Nose normal. Right sinus exhibits no maxillary sinus tenderness and no frontal sinus tenderness. Left sinus exhibits no maxillary sinus tenderness and no frontal sinus tenderness.  Mouth/Throat: Uvula is midline, oropharynx is clear and moist and mucous membranes are normal.  Eyes: EOM are normal.  Neck: Normal range of motion. Neck supple.  Cardiovascular: Normal rate and regular rhythm.  Pulmonary/Chest: Effort normal and breath sounds normal. No stridor. No respiratory distress. She has no wheezes. She has no rales.  Musculoskeletal: Normal range of motion.  Neurological: She is alert and oriented to person, place, and time.  Skin: Skin is warm and dry. She is not diaphoretic.  Psychiatric: She has a normal mood and affect. Her behavior is normal.  Nursing note and vitals  reviewed.    UC Treatments / Results  Labs (all labs ordered are listed, but only abnormal results are displayed) Labs Reviewed - No data to display  EKG None  Radiology No results found.  Procedures Procedures (including critical care time)  Medications Ordered in UC Medications - No data to display  Initial Impression / Assessment and Plan / UC Course  I have reviewed the triage vital signs and the nursing notes.  Pertinent labs & imaging results that were available during my care of the patient were reviewed by me and considered in my medical decision making (see chart for details).     Given duration of symptoms, will tx with azithromycin Home care instructions provided.  Final Clinical Impressions(s) / UC Diagnoses   Final diagnoses:  Persistent cough for 3 weeks or longer  Upper respiratory tract infection, unspecified type     Discharge Instructions      Please take antibiotics as prescribed and be sure to complete entire course even if you start to feel better to ensure infection does not come back.  Please follow up with family medicine in 1 week if not improving.     ED Prescriptions    Medication Sig Dispense Auth. Provider   azithromycin (ZITHROMAX) 250 MG tablet Take 1 tablet (250 mg total) by mouth daily. Take first 2 tablets together, then 1 every day until finished. 6 tablet Noe Gens, PA-C     Controlled Substance Prescriptions [place] Controlled Substance Registry consulted? Not Applicable   Tyrell Antonio 12/13/17 2423

## 2017-12-12 NOTE — ED Triage Notes (Signed)
Pt c/o cough and congestion x3 weeks. Afebrile and taking sudafed.

## 2017-12-12 NOTE — Discharge Instructions (Signed)
°  Please take antibiotics as prescribed and be sure to complete entire course even if you start to feel better to ensure infection does not come back. ° °Please follow up with family medicine in 1 week if not improving.  °

## 2017-12-14 ENCOUNTER — Encounter (HOSPITAL_BASED_OUTPATIENT_CLINIC_OR_DEPARTMENT_OTHER): Payer: Medicare Other

## 2017-12-21 ENCOUNTER — Ambulatory Visit (HOSPITAL_BASED_OUTPATIENT_CLINIC_OR_DEPARTMENT_OTHER): Payer: Medicare Other | Attending: Family | Admitting: Pulmonary Disease

## 2017-12-21 VITALS — Ht 62.0 in | Wt 149.0 lb

## 2017-12-21 DIAGNOSIS — G4733 Obstructive sleep apnea (adult) (pediatric): Secondary | ICD-10-CM | POA: Insufficient documentation

## 2017-12-21 DIAGNOSIS — G4731 Primary central sleep apnea: Secondary | ICD-10-CM | POA: Diagnosis not present

## 2017-12-22 DIAGNOSIS — G4733 Obstructive sleep apnea (adult) (pediatric): Secondary | ICD-10-CM | POA: Diagnosis not present

## 2017-12-23 NOTE — Procedures (Signed)
  Patient Name: Anna Peterson, Anna Peterson Date: 12/21/2017   Gender: Female  D.O.B: Sep 30, 1950  Age (years): 67  Referring Provider: Earlie Counts  Height (inches): 62  Interpreting Physician: Kara Mead MD, ABSM  Weight (lbs): 149  RPSGT: Zadie Rhine  BMI: 27  MRN: [id number]  Neck Size: 13.00  <br> <br>  CLINICAL INFORMATION  The patient is referred for a BiPAP titration to treat sleep apnea. Date of HST: 10/2017, AHI 40/h SLEEP STUDY TECHNIQUE  As per the AASM Manual for the Scoring of Sleep and Associated Events v2.3 (April 2016) with a hypopnea requiring 4% desaturations. The channels recorded and monitored were frontal, central and occipital EEG, electrooculogram (EOG), submentalis EMG (chin), nasal and oral airflow, thoracic and abdominal wall motion, anterior tibialis EMG, snore microphone, electrocardiogram, and pulse oximetry. Bilevel positive airway pressure (BPAP) was initiated at the beginning of the study and titrated to treat sleep-disordered breathing. MEDICATIONS  Medications self-administered by patient taken the night of the study : TYLENOL ARTHRITIS RESPIRATORY PARAMETERS  Optimal IPAP Pressure (cm): 22 AHI at Optimal Pressure (/hr) 0.0  Optimal EPAP Pressure (cm): 19      Overall Minimal O2 (%): 78.0 Minimal O2 at Optimal Pressure (%): 94.0  SLEEP ARCHITECTURE  Start Time: 10:56:02 PM Stop Time: 5:05:21 AM Total Time (min): 369.3 Total Sleep Time (min): 267.5  Sleep Latency (min): 2.9 Sleep Efficiency (%): 72.4% REM Latency (min): 108.5 WASO (min): 98.9  Stage N1 (%): 12.5% Stage N2 (%): 58.3% Stage N3 (%): 10.8% Stage R (%): 18.3  Supine (%): 100.00 Arousal Index (/hr): 38.1          CARDIAC DATA  The 2 lead EKG demonstrated sinus rhythm. The mean heart rate was 57.2 beats per minute. Other EKG findings include: None.  LEG MOVEMENT DATA  The total Periodic Limb Movements of Sleep (PLMS) were 0. The PLMS index was 0.0. A PLMS index of <15 is considered  normal in adults. IMPRESSIONS  - An optimal PAP pressure was selected for this patient ( 22 / 19 cm of water) - Moderate Central Sleep Apnea was noted during this titration (CAI = 24.7/h).  - Severe oxygen desaturations were observed during this titration (min O2 = 78.0%).  - No snoring was audible during this study.  - No cardiac abnormalities were observed during this study.  - Clinically significant periodic limb movements were not noted during this study. Arousals associated with PLMs were rare. DIAGNOSIS  - Obstructive Sleep Apnea (327.23 [G47.33 ICD-10]) - Treatment emergent central sleep apnea? complex sleep apnea RECOMMENDATIONS  - Trial of auto CPAP 10-20cm with a Small size Resmed Full Face Mask AirFit F20 mask and heated humidification. - Will need follow up with sleep MD to assess download. If centrals persist inspite of 4-6 weeks of therapy, consider BiPAP with goal 22/19 cm H2O - Avoid alcohol, sedatives and other CNS depressants that may worsen sleep apnea and disrupt normal sleep architecture.  - Sleep hygiene should be reviewed to assess factors that may improve sleep quality.  - Weight management and regular exercise should be initiated or continued.  - Return to Sleep Center for re-evaluation after 4 weeks of therapy   Kara Mead MD Board Certified in [place]

## 2017-12-24 ENCOUNTER — Telehealth: Payer: Self-pay | Admitting: Family

## 2017-12-24 DIAGNOSIS — G4733 Obstructive sleep apnea (adult) (pediatric): Secondary | ICD-10-CM

## 2017-12-24 NOTE — Telephone Encounter (Signed)
Please advise pt that I am setting up cpap for her but also need her to see a sleep specialist to review her cpap/settings for her sleep apnea.

## 2017-12-30 NOTE — Telephone Encounter (Signed)
Author phoned pt. To relay Melissa's message, and pt. Was amenable to plan.

## 2018-01-25 ENCOUNTER — Telehealth: Payer: Self-pay

## 2018-01-25 NOTE — Telephone Encounter (Signed)
Copied from [place] [phone]. Topic: General - Other >> Jan 25, 2018  9:42 AM Alfredia Ferguson R wrote: Patient called in and stated that she would like a new cpap machine mask due to the one she currently has messing up her face. She is requesting the Airfit F30  CB# [phone]

## 2018-01-26 NOTE — Telephone Encounter (Signed)
Patient called her Anna Peterson, they will check with her insurance company to see if a new mask is cover. Advised patient to let them contact us if they need a new rx.

## 2018-02-02 ENCOUNTER — Encounter: Payer: Self-pay | Admitting: Pulmonary Disease

## 2018-02-02 ENCOUNTER — Ambulatory Visit (INDEPENDENT_AMBULATORY_CARE_PROVIDER_SITE_OTHER): Payer: Medicare Other | Admitting: Pulmonary Disease

## 2018-02-02 VITALS — BP 110/68 | HR 75

## 2018-02-02 DIAGNOSIS — G4733 Obstructive sleep apnea (adult) (pediatric): Secondary | ICD-10-CM | POA: Diagnosis not present

## 2018-02-02 NOTE — Patient Instructions (Addendum)
Severe OSA  Has only had machine for about a week  Suboptimal treatment at present with AHI still above 10  DME referral  Trial with a chinstrap  Elevate head of bed  I will see you back in the office in about a month with a repeat download If AHI is still not improved at that time, BiPAP treatment may be our option  Call with any concerns

## 2018-02-02 NOTE — Progress Notes (Signed)
Anna Peterson    [id number]    1950-09-11  Primary Care Physician:[name], [name], NP  Referring Physician: [name], NP [name] [name], [name] [address]  Chief complaint:   Patient with a history of obstructive sleep apnea Presence of treatment emergent central sleep apnea on recent titration Has been using CPAP  HPI:  Patient with severe obstructive sleep apnea was started on auto titrating CPAP 10-20 Has had some difficulty with mask fit, the first mask did affect the nasal bridge A current mask does have some leak, dryness of the mouth on a regular basis-I believe this is probably secondary to oral venting She is on high pressures Download of about 10 days does reveal a median pressure of 14.2, 95 percentile of 18.8 with a maximum 19.8 Epworth sleepiness of 6 Usual headaches have improved Activity is maintained,  Reformed smoker    usually goes to bed about 1030 to 11:30 PM, falls asleep in about 15 minutes Tries to get out of bed between 630 and 8 AM About 3 awakenings through the night  Occupation: No pertinent history Exposures: Smoking history: Reformed smoker, quit in 1972, did smoke heavy  Outpatient Encounter Medications as of 02/02/2018  Medication Sig  . albuterol (PROVENTIL HFA;VENTOLIN HFA) 108 (90 Base) MCG/ACT inhaler INHALE TWO PUFFS INTO THE LUNGS EVERY 6 (SIX) HOURS AS NEEDED FOR WHEEZING.  . fluticasone (FLONASE) 50 MCG/ACT nasal spray Place 1 spray into both nostrils daily as needed for allergies or rhinitis.  . Garlic 0263 MG CAPS Take 1 capsule by mouth daily.  Marland Kitchen loratadine (CLARITIN) 10 MG tablet Take 10 mg by mouth daily as needed for allergies.  . Multiple Vitamins-Minerals (MULTIVITAL PO) Take 1 tablet by mouth daily. MNS3. (metabolic nutrition system)  . Omega-3 Fatty Acids (FISH OIL) 1200 MG CAPS Take 1,200 mg by mouth 2 (two) times daily.  . SUMAtriptan (IMITREX) 100 MG tablet Take 100 mg by  mouth every 2 (two) hours as needed for migraine. May repeat in 2 hours if headache persists or recurs.  . [DISCONTINUED] azithromycin (ZITHROMAX) 250 MG tablet Take 1 tablet (250 mg total) by mouth daily. Take first 2 tablets together, then 1 every day until finished.   No facility-administered encounter medications on file as of 02/02/2018.     Allergies as of 02/02/2018 - Review Complete 02/02/2018  Allergen Reaction Noted  . Cabbage  12/29/2016  . Other Swelling 10/03/2016  . Ceclor [cefaclor] Rash 12/20/2013  . Latex Rash 12/26/2013    Past Medical History:  Diagnosis Date  . Allergy   . Asthma   . History of chicken pox   . Hyperlipidemia   . Migraine   . OSA (obstructive sleep apnea) 11/10/2017   Severe per home study 7/19  . OSA (obstructive sleep apnea) 11/10/2017  . Osteopenia    Osteopenia per Dexa Scan    Past Surgical History:  Procedure Laterality Date  . CESAREAN SECTION    . KNEE SURGERY Left 2007   meniscus repair    Family History  Problem Relation Age of Onset  . Aneurysm Mother 104       Cerebral age 16  . Pancreatic cancer Maternal Grandmother   . Arthritis Maternal Grandmother   . Stroke Maternal Grandfather 58  . Arthritis Maternal Grandfather   . Breast cancer Paternal Grandmother 22  . Arthritis Paternal Grandmother   . Arthritis Paternal Grandfather     Social History  Socioeconomic History  . Marital status: Married    Spouse name: Not on file  . Number of children: Not on file  . Years of education: Not on file  . Highest education level: Not on file  Occupational History  . Not on file  Social Needs  . Financial resource strain: Not on file  . Food insecurity:    Worry: Not on file    Inability: Not on file  . Transportation needs:    Medical: Not on file    Non-medical: Not on file  Tobacco Use  . Smoking status: Former Smoker    Types: Cigarettes    Last attempt to quit: 12/06/1970    Years since quitting: 47.1  .  Smokeless tobacco: Never Used  Substance and Sexual Activity  . Alcohol use: Yes    Alcohol/week: 5.0 standard drinks    Types: 5 Glasses of wine per week  . Drug use: No  . Sexual activity: Not on file  Lifestyle  . Physical activity:    Days per week: Not on file    Minutes per session: Not on file  . Stress: Not on file  Relationships  . Social connections:    Talks on phone: Not on file    Gets together: Not on file    Attends religious service: Not on file    Active member of club or organization: Not on file    Attends meetings of clubs or organizations: Not on file    Relationship status: Not on file  . Intimate partner violence:    Fear of current or ex partner: Not on file    Emotionally abused: Not on file    Physically abused: Not on file    Forced sexual activity: Not on file  Other Topics Concern  . 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  Live in 4 different states.  Oldest son Anna Peterson lives locally. Second son Anna Peterson- lives in [place], daughter lives in [place], Anna Peterson lives on [place]   No pets   Married for 43 years.      Review of Systems  Constitutional: Positive for fatigue.  Eyes: Negative.   Respiratory: Positive for apnea and cough.   Cardiovascular: Negative.   Gastrointestinal: Negative.   Endocrine: Negative.   Genitourinary: Negative.   Musculoskeletal: Negative.   Psychiatric/Behavioral: Positive for sleep disturbance.    Vitals:   02/02/18 1335  BP: 110/68  Pulse: 75  SpO2: 96%     Physical Exam  Constitutional: She appears well-developed and well-nourished.  HENT:  Head: Normocephalic and atraumatic.  Eyes: Pupils are equal, round, and reactive to light. Conjunctivae and EOM are normal. Right eye exhibits no  discharge. Left eye exhibits no discharge.  Neck: Normal range of motion. Neck supple. No thyromegaly present.  Cardiovascular: Normal rate and regular rhythm.  No murmur heard. Pulmonary/Chest: Breath sounds normal. No respiratory distress. She has no wheezes.  Abdominal: Soft. Bowel sounds are normal. She exhibits no distension. There is no tenderness.   Data Reviewed:  Compliance data does reveal AHI of 12.8  Assessment:  Obstructive sleep apnea with treatment emergent centrals she is on auto titrating CPAP at present between 10 and 20 We only have about  10 days of data at the present time Concerns regarding dry mouth-I believe this may be related to oral venting She is already on high pressures at the present time   Plan/Recommendations:  Elevation of the head of the bed by about 30 degrees will help  Avoid supine sleep as possible  DM a referral for initiation of a chinstrap  I will see her back in the office in about 1 month with a download  Further intervention/changes in care will depend on findings at the time I do believe elevation of 8 out of the bed may reduce her pressure and allow for better tolerance of current pressures  Encouraged to call with any significant concerns   Sherrilyn Rist MD [facility] 02/02/2018, 2:09 PM  CC: Anna Alar, NP

## 2018-02-23 ENCOUNTER — Ambulatory Visit: Payer: Medicare Other | Admitting: Family

## 2018-02-26 ENCOUNTER — Ambulatory Visit: Payer: Medicare Other | Admitting: Pulmonary Disease

## 2018-02-26 ENCOUNTER — Encounter: Payer: Self-pay | Admitting: Pulmonary Disease

## 2018-02-26 ENCOUNTER — Telehealth: Payer: Self-pay | Admitting: Pulmonary Disease

## 2018-02-26 VITALS — BP 110/82 | HR 72 | Ht 62.0 in | Wt 147.0 lb

## 2018-02-26 DIAGNOSIS — Z9989 Dependence on other enabling machines and devices: Secondary | ICD-10-CM | POA: Diagnosis not present

## 2018-02-26 DIAGNOSIS — G4733 Obstructive sleep apnea (adult) (pediatric): Secondary | ICD-10-CM | POA: Diagnosis not present

## 2018-02-26 NOTE — Progress Notes (Signed)
Anna Peterson    [id number]    February 21, 1951  Primary Care Physician:[name], [name], NP  Referring Physician: [name], NP [address] [place], [place] [address]  Chief complaint:   Patient with a history of obstructive sleep apnea Presence of treatment emergent central sleep apnea on recent titration Has been using CPAP -still adjusting to it but using it nightly  HPI:  Patient with severe obstructive sleep apnea was started on auto titrating CPAP 10-20 Has had some difficulty with mask fit, the first mask did affect the nasal bridge A current mask does have some leak, dryness of the mouth on a regular basis-I believe this is probably secondary to oral venting She did try using a chinstrap-did not tolerate this until She is on high pressures 95 percentile pressure of 18.3  Improvement in AHI from previous  The only change that she is made so far is elevation of the head of the bed which she noticed did help significantly  Epworth sleepiness of 5, compared to 6 during the last visit Usual headaches have improved Activity is maintained,  Reformed smoker    usually goes to bed about 1030 to 11:30 PM, falls asleep in about 15 minutes Tries to get out of bed between 630 and 8 AM Still wakes up a couple of times during the night  Occupation: No pertinent history Exposures: Smoking history: Reformed smoker, quit in 1972, did smoke heavy  Outpatient Encounter Medications as of 02/26/2018  Medication Sig  . albuterol (PROVENTIL HFA;VENTOLIN HFA) 108 (90 Base) MCG/ACT inhaler INHALE TWO PUFFS INTO THE LUNGS EVERY 6 (SIX) HOURS AS NEEDED FOR WHEEZING.  . fluticasone (FLONASE) 50 MCG/ACT nasal spray Place 1 spray into both nostrils daily as needed for allergies or rhinitis.  . Garlic 5003 MG CAPS Take 1 capsule by mouth daily.  Marland Kitchen loratadine (CLARITIN) 10 MG tablet Take 10 mg by mouth daily as needed for allergies.  . Multiple  Vitamins-Minerals (MULTIVITAL PO) Take 1 tablet by mouth daily. MNS3. (metabolic nutrition system)  . Omega-3 Fatty Acids (FISH OIL) 1200 MG CAPS Take 1,200 mg by mouth 2 (two) times daily.  . SUMAtriptan (IMITREX) 100 MG tablet Take 100 mg by mouth every 2 (two) hours as needed for migraine. May repeat in 2 hours if headache persists or recurs.   No facility-administered encounter medications on file as of 02/26/2018.     Allergies as of 02/26/2018 - Review Complete 02/26/2018  Allergen Reaction Noted  . Cabbage  12/29/2016  . Other Swelling 10/03/2016  . Ceclor [cefaclor] Rash 12/20/2013  . Latex Rash 12/26/2013    Past Medical History:  Diagnosis Date  . Allergy   . Asthma   . History of chicken pox   . Hyperlipidemia   . Migraine   . OSA (obstructive sleep apnea) 11/10/2017   Severe per home study 7/19  . OSA (obstructive sleep apnea) 11/10/2017  . Osteopenia    Osteopenia per Dexa Scan    Past Surgical History:  Procedure Laterality Date  . CESAREAN SECTION    . KNEE SURGERY Left 2007   meniscus repair    Family History  Problem Relation Age of Onset  . Aneurysm Mother 25       Cerebral age 65  . Pancreatic cancer Maternal Grandmother   . Arthritis Maternal Grandmother   . Stroke Maternal Grandfather 85  . Arthritis Maternal Grandfather   . Breast cancer Paternal Grandmother 61  .  Arthritis Paternal Grandmother   . Arthritis Paternal Grandfather     Social History   Socioeconomic History  . Marital status: Married    Spouse name: Not on file  . Number of children: Not on file  . Years of education: Not on file  . Highest education level: Not on file  Occupational History  . Not on file  Social Needs  . Financial resource strain: Not on file  . Food insecurity:    Worry: Not on file    Inability: Not on file  . Transportation needs:    Medical: Not on file    Non-medical: Not on file  Tobacco Use  . Smoking status: Former Smoker    Types:  Cigarettes    Last attempt to quit: 12/06/1970    Years since quitting: 47.2  . Smokeless tobacco: Never Used  Substance and Sexual Activity  . Alcohol use: Yes    Alcohol/week: 5.0 standard drinks    Types: 5 Glasses of wine per week  . Drug use: No  . Sexual activity: Not on file  Lifestyle  . Physical activity:    Days per week: Not on file    Minutes per session: Not on file  . Stress: Not on file  Relationships  . Social connections:    Talks on phone: Not on file    Gets together: Not on file    Attends religious service: Not on file    Active member of club or organization: Not on file    Attends meetings of clubs or organizations: Not on file    Relationship status: Not on file  . Intimate partner violence:    Fear of current or ex partner: Not on file    Emotionally abused: Not on file    Physically abused: Not on file    Forced sexual activity: Not on file  Other Topics Concern  . 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  Live in 4 different states.  Oldest son Corene Cornea lives locally. Second son Shanon Brow- lives in [place], daughter lives in [place], [place] lives on [address]   No pets   Married for 43 years.      Review of Systems  Constitutional: Positive for fatigue.  Eyes: Negative.   Respiratory: Positive for apnea.   Cardiovascular: Negative.   Gastrointestinal: Negative.   Genitourinary: Positive for vaginal discharge.  Psychiatric/Behavioral: Positive for sleep disturbance.  All other systems reviewed and are negative.   Vitals:   02/26/18 1017  BP: 110/82  Pulse: 72  SpO2: 99%     Physical Exam  Constitutional: She appears well-developed and well-nourished.  HENT:  Head: Normocephalic and atraumatic.  Eyes: Right eye exhibits no  discharge. Left eye exhibits no discharge.  Neck: Normal range of motion. Neck supple. No tracheal deviation present. No thyromegaly present.  Cardiovascular: Normal rate and regular rhythm.  No murmur heard. Pulmonary/Chest: Effort normal and breath sounds normal. No respiratory distress. She has no wheezes.  Abdominal: Soft. Bowel sounds are normal. She exhibits no distension. There is no tenderness.   Data Reviewed:  Compliance data does reveal AHI of 7.5, this is an improvement from 12.8 from recent visit  Assessment:  Obstructive sleep apnea with  treatment emergent centrals she is on auto titrating CPAP at present between 10 and 20 Appears to be tolerating CPAP well with excellent compliance  Concerns regarding dry mouth-I believe this may be related to oral venting -Was not able to tolerate a chinstrap  -Sleeps with the head of the bed elevated at present and this seems to be helping   Plan/Recommendations:  Continue with head of the bed elevation  Avoid supine sleep as possible  I will see her back in the office in about 4 to 5 months  Other modalities of treatment for sleep disordered breathing discussed  Encouraged to call with any significant concerns   Sherrilyn Rist MD [facility] 02/26/2018, 10:20 AM  CC: [name], NP

## 2018-02-26 NOTE — Patient Instructions (Signed)
Obstructive sleep apnea Excellent compliance with treatment with improvement in residual AHI  Continue current treatment  I will see you in about 4 to 5 months Call with significant symptoms

## 2018-03-01 NOTE — Telephone Encounter (Signed)
Error

## 2018-03-08 ENCOUNTER — Telehealth: Payer: Self-pay | Admitting: *Deleted

## 2018-03-08 NOTE — Telephone Encounter (Signed)
Received Patient Compliance Report from [facility] via [facility]; forwarded to provider/SLS 12/02

## 2018-04-29 ENCOUNTER — Ambulatory Visit (INDEPENDENT_AMBULATORY_CARE_PROVIDER_SITE_OTHER): Payer: Medicare Other | Admitting: Family Medicine

## 2018-04-29 ENCOUNTER — Ambulatory Visit (HOSPITAL_BASED_OUTPATIENT_CLINIC_OR_DEPARTMENT_OTHER)
Admission: RE | Admit: 2018-04-29 | Discharge: 2018-04-29 | Disposition: A | Discharge status: Home / Self Care | Payer: Medicare Other | Source: Ambulatory Visit | Attending: Family Medicine | Admitting: Family Medicine

## 2018-04-29 ENCOUNTER — Encounter: Payer: Self-pay | Admitting: Family Medicine

## 2018-04-29 VITALS — BP 114/80 | HR 91 | Temp 98.1°F | Resp 16 | Ht 62.0 in | Wt 150.0 lb

## 2018-04-29 DIAGNOSIS — R509 Fever, unspecified: Secondary | ICD-10-CM | POA: Diagnosis present

## 2018-04-29 DIAGNOSIS — R05 Cough: Secondary | ICD-10-CM

## 2018-04-29 DIAGNOSIS — J111 Influenza due to unidentified influenza virus with other respiratory manifestations: Secondary | ICD-10-CM

## 2018-04-29 DIAGNOSIS — J029 Acute pharyngitis, unspecified: Secondary | ICD-10-CM | POA: Diagnosis present

## 2018-04-29 DIAGNOSIS — R059 Cough, unspecified: Secondary | ICD-10-CM

## 2018-04-29 LAB — POC INFLUENZA A&B (BINAX/QUICKVUE)
INFLUENZA A, POC: NEGATIVE
Influenza B, POC: NEGATIVE

## 2018-04-29 LAB — POCT RAPID STREP A (OFFICE): RAPID STREP A SCREEN: NEGATIVE

## 2018-04-29 MED ORDER — OSELTAMIVIR PHOSPHATE 75 MG PO CAPS: 75.0000 mg | ORAL_CAPSULE | Freq: Two times a day (BID) | ORAL | 0 refills | Status: DC

## 2018-04-29 NOTE — Progress Notes (Signed)
[facility] [address], [place], [place] [address] [phone]  Date:  04/29/2018   Name:  Anna Peterson   DOB:  12-24-1950   MRN:  [id number]  PCP:  [name], NP    Chief Complaint: URI (10 days ago, fever 101 last night, headache, productive cough, nasal congestion, chills)   History of Present Illness:  Anna Peterson is a 68 y.o. very pleasant female patient who presents with the following:  Pt of Melissa here today with illness She notes that 10 days ago she developed mild cold sx- did not seem to be anything serious but yesterday she started to feel worse Last night she had a HA and checked her temp- 101 She also has a cough, ST, nasal congestion, chills No body aches  Took tylenol this am - this helped with her fever She is bringing up discolored phlegm from her chest  Her nasal mucus is clear  No vomiting or diarrhea She was recently around her grand-kids, one of whom had the flu.   She has 9 grandchildren, spread across several states  Former smoker, quit many years ago  Patient Active Problem List   Diagnosis Date Noted  . OSA (obstructive sleep apnea) 11/10/2017  . Onychomycosis 12/29/2016  . Asthma 12/29/2016  . Right knee pain 12/29/2016  . Osteopenia 10/14/2016  . Incomplete right bundle branch block 10/03/2016  . Dizziness 12/26/2015  . Hyperlipidemia 03/29/2014  . Dysfunction of both eustachian tubes 03/10/2014    Past Medical History:  Diagnosis Date  . Allergy   . Asthma   . History of chicken pox   . Hyperlipidemia   . Migraine   . OSA (obstructive sleep apnea) 11/10/2017   Severe per home study 7/19  . OSA (obstructive sleep apnea) 11/10/2017  . Osteopenia    Osteopenia per Dexa Scan    Past Surgical History:  Procedure Laterality Date  . CESAREAN SECTION    . KNEE SURGERY Left 2007   meniscus repair    Social History   Tobacco Use  . Smoking status: Former  Smoker    Types: Cigarettes    Last attempt to quit: 12/06/1970    Years since quitting: 47.4  . Smokeless tobacco: Never Used  Substance Use Topics  . Alcohol use: Yes    Alcohol/week: 5.0 standard drinks    Types: 5 Glasses of wine per week  . Drug use: No    Family History  Problem Relation Age of Onset  . Aneurysm Mother 52       Cerebral age 71  . Pancreatic cancer Maternal Grandmother   . Arthritis Maternal Grandmother   . Stroke Maternal Grandfather 28  . Arthritis Maternal Grandfather   . Breast cancer Paternal Grandmother 38  . Arthritis Paternal Grandmother   . Arthritis Paternal Grandfather     Allergies  Allergen Reactions  . Levonorgestrel-Ethinyl Estrad Other (See Comments)    Sneezing Sneezing  . Cabbage     "Migraine"  . Other Swelling    Peaches  . Ceclor [Cefaclor] Rash  . Latex Rash    Medication list has been reviewed and updated.  Current Outpatient Medications on File Prior to Visit  Medication Sig Dispense Refill  . albuterol (PROVENTIL HFA;VENTOLIN HFA) 108 (90 Base) MCG/ACT inhaler INHALE TWO PUFFS INTO THE LUNGS EVERY 6 (SIX) HOURS AS NEEDED FOR WHEEZING. 8.5 Inhaler 2  . fluticasone (FLONASE) 50 MCG/ACT nasal spray Place  1 spray into both nostrils daily as needed for allergies or rhinitis.    . Garlic 9622 MG CAPS Take 1 capsule by mouth daily.    Marland Kitchen loratadine (CLARITIN) 10 MG tablet Take 10 mg by mouth daily as needed for allergies.    . Multiple Vitamins-Minerals (MULTIVITAL PO) Take 1 tablet by mouth daily. MNS3. (metabolic nutrition system)    . Omega-3 Fatty Acids (FISH OIL) 1200 MG CAPS Take 1,200 mg by mouth 2 (two) times daily.    . SUMAtriptan (IMITREX) 100 MG tablet Take 100 mg by mouth every 2 (two) hours as needed for migraine. May repeat in 2 hours if headache persists or recurs.     No current facility-administered medications on file prior to visit.     Review of Systems:  As per HPI- otherwise negative.  No vomiting or  diarrhea, no rash Physical Examination: Vitals:   04/29/18 1044  BP: 114/80  Pulse: 91  Resp: 16  Temp: 98.1 F (36.7 C)  SpO2: 97%   Vitals:   04/29/18 1044  Weight: 150 lb (68 kg)  Height: 5\' 2"  (1.575 m)   Body mass index is 27.44 kg/m. Ideal Body Weight: Weight in (lb) to have BMI = 25: 136.4  GEN: WDWN, NAD, Non-toxic, A & O x 3, mild overweight, looks well HEENT: Atraumatic, Normocephalic. Neck supple. No masses, No LAD.  Bilateral TM wnl, oropharynx normal.  PEERL,EOMI.   Ears and Nose: No external deformity. CV: RRR, No M/G/R. No JVD. No thrill. No extra heart sounds. PULM: CTA B, no wheezes, crackles, rhonchi. No retractions. No resp. distress. No accessory muscle use. ABD: S, NT, ND EXTR: No c/c/e NEURO Normal gait.  PSYCH: Normally interactive. Conversant. Not depressed or anxious appearing.  Calm demeanor.   Results for orders placed or performed in visit on 04/29/18  POCT rapid strep A  Result Value Ref Range   Rapid Strep A Screen Negative Negative  POC Influenza A&B (Binax test)  Result Value Ref Range   Influenza A, POC Negative Negative   Influenza B, POC Negative Negative   Dg Chest 2 View  Result Date: 04/29/2018 CLINICAL DATA:  Fever and chills and cough EXAM: CHEST - 2 VIEW COMPARISON:  None. FINDINGS: The heart size and mediastinal contours are within normal limits. Both lungs are clear. The visualized skeletal structures are unremarkable. IMPRESSION: No active cardiopulmonary disease. Electronically Signed   By: Franchot Gallo M.D.   On: 04/29/2018 11:22    Assessment and Plan: Fever and chills - Plan: POC Influenza A&B (Binax test), DG Chest 2 View  Sore throat - Plan: POCT rapid strep A, DG Chest 2 View  Cough - Plan: DG Chest 2 View  Influenza - Plan: oseltamivir (TAMIFLU) 75 MG capsule  Generally healthy woman here today with typical flu symptoms.  Her rapid flu is negative, but she has no other apparent infection.  Chest x-ray is  negative.  As were at the height of flu season, we will go ahead and treat her with Tamiflu now.  Have discussed with patient, we suspect flu although her rapid test was negative.  She is asked to contact us if not feeling better in the next few days, sooner if she starts feeling worse [name] states understanding of and agreement with our plan  Signed Lamar Blinks, MD

## 2018-04-29 NOTE — Patient Instructions (Signed)
We suspect that you have the flu Take the tamiflu twice a day for 5 days  If you are not feeling better- or if you start getting worse- over the next few days please call the answering service

## 2018-05-05 ENCOUNTER — Ambulatory Visit: Payer: Self-pay

## 2018-05-05 ENCOUNTER — Encounter: Payer: Self-pay | Admitting: Medical

## 2018-05-05 ENCOUNTER — Ambulatory Visit (INDEPENDENT_AMBULATORY_CARE_PROVIDER_SITE_OTHER): Payer: Medicare Other | Admitting: Medical

## 2018-05-05 VITALS — BP 136/81 | HR 86 | Temp 98.6°F | Resp 16 | Ht 62.0 in | Wt 151.4 lb

## 2018-05-05 DIAGNOSIS — J0100 Acute maxillary sinusitis, unspecified: Secondary | ICD-10-CM

## 2018-05-05 DIAGNOSIS — J40 Bronchitis, not specified as acute or chronic: Secondary | ICD-10-CM | POA: Diagnosis not present

## 2018-05-05 MED ORDER — SUMATRIPTAN SUCCINATE 100 MG PO TABS: 100.0000 mg | ORAL_TABLET | ORAL | 1 refills | Status: DC | PRN

## 2018-05-05 MED ORDER — AZITHROMYCIN 250 MG PO TABS: ORAL_TABLET | ORAL | 0 refills | Status: DC

## 2018-05-05 NOTE — Progress Notes (Signed)
Subjective:    Patient ID: Anna Peterson, female    DOB: 1951/03/09, 68 y.o.   MRN: [id number]  HPI   Pt had flu like syndrome past week. Given flu med/tamiflu. Since then she has developed nasal congestion, sinus pressure/pain, low grade fever and some productive cough. Hx of sinus infections. Pt has avoided sudafed since her bp increases. She did use flonase.  Pt is blowing out blood tinged with mucus from her nose.  No fever, no chills or sweats.  Pt thinks sinus infection following the flu.    Review of Systems  Constitutional: Positive for fatigue. Negative for chills and fever.  HENT: Positive for congestion, sinus pressure and sinus pain.   Respiratory: Positive for cough. Negative for wheezing and stridor.   Cardiovascular: Negative for chest pain and palpitations.  Gastrointestinal: Negative for abdominal pain.  Musculoskeletal: Negative for back pain and myalgias.  Skin: Negative for rash.  Neurological: Negative for dizziness and headaches.  Hematological: Negative for adenopathy. Does not bruise/bleed easily.  Psychiatric/Behavioral: Negative for behavioral problems.    Past Medical History:  Diagnosis Date  . Allergy   . Asthma   . History of chicken pox   . Hyperlipidemia   . Migraine   . OSA (obstructive sleep apnea) 11/10/2017   Severe per home study 7/19  . OSA (obstructive sleep apnea) 11/10/2017  . Osteopenia    Osteopenia per Dexa Scan     Social History   Socioeconomic History  . Marital status: Married    Spouse name: Not on file  . Number of children: Not on file  . Years of education: Not on file  . Highest education level: Not on file  Occupational History  . Not on file  Social Needs  . Financial resource strain: Not on file  . Food insecurity:    Worry: Not on file    Inability: Not on file  . Transportation needs:    Medical: Not on file    Non-medical: Not on file  Tobacco Use  . Smoking status: Former Smoker    Types:  Cigarettes    Last attempt to quit: 12/06/1970    Years since quitting: 47.4  . Smokeless tobacco: Never Used  Substance and Sexual Activity  . Alcohol use: Yes    Alcohol/week: 5.0 standard drinks    Types: 5 Glasses of wine per week  . Drug use: No  . Sexual activity: Not on file  Lifestyle  . Physical activity:    Days per week: Not on file    Minutes per session: Not on file  . Stress: Not on file  Relationships  . Social connections:    Talks on phone: Not on file    Gets together: Not on file    Attends religious service: Not on file    Active member of club or organization: Not on file    Attends meetings of clubs or organizations: Not on file    Relationship status: Not on file  . Intimate partner violence:    Fear of current or ex partner: Not on file    Emotionally abused: Not on file    Physically abused: Not on file    Forced sexual activity: Not on file  Other Topics Concern  . 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  Live in 4 different states.  Oldest son Corene Cornea lives locally. Second son Shanon Brow- lives in [place], daughter lives in [place], Marlin lives on [place]   No pets   Married for 43 years.      Past Surgical History:  Procedure Laterality Date  . CESAREAN SECTION    . KNEE SURGERY Left 2007   meniscus repair    Family History  Problem Relation Age of Onset  . Aneurysm Mother 14       Cerebral age 30  . Pancreatic cancer Maternal Grandmother   . Arthritis Maternal Grandmother   . Stroke Maternal Grandfather 47  . Arthritis Maternal Grandfather   . Breast cancer Paternal Grandmother 65  . Arthritis Paternal Grandmother   . Arthritis Paternal Grandfather     Allergies  Allergen Reactions  . Cabbage     "Migraine"  . Other  Swelling    Peaches  . Ceclor [Cefaclor] Rash  . Latex Rash    Current Outpatient Medications on File Prior to Visit  Medication Sig Dispense Refill  . albuterol (PROVENTIL HFA;VENTOLIN HFA) 108 (90 Base) MCG/ACT inhaler INHALE TWO PUFFS INTO THE LUNGS EVERY 6 (SIX) HOURS AS NEEDED FOR WHEEZING. 8.5 Inhaler 2  . fluticasone (FLONASE) 50 MCG/ACT nasal spray Place 1 spray into both nostrils daily as needed for allergies or rhinitis.    . Garlic 2248 MG CAPS Take 1 capsule by mouth daily.    Marland Kitchen loratadine (CLARITIN) 10 MG tablet Take 10 mg by mouth daily as needed for allergies.    . Multiple Vitamins-Minerals (MULTIVITAL PO) Take 1 tablet by mouth daily. MNS3. (metabolic nutrition system)    . Omega-3 Fatty Acids (FISH OIL) 1200 MG CAPS Take 1,200 mg by mouth 2 (two) times daily.    Marland Kitchen oseltamivir (TAMIFLU) 75 MG capsule Take 1 capsule (75 mg total) by mouth 2 (two) times daily. 10 capsule 0  . SUMAtriptan (IMITREX) 100 MG tablet Take 100 mg by mouth every 2 (two) hours as needed for migraine. May repeat in 2 hours if headache persists or recurs.     No current facility-administered medications on file prior to visit.     BP 136/81   Pulse 86   Temp 98.6 F (37 C) (Oral)   Resp 16   Ht 5\' 2"  (1.575 m)   Wt 151 lb 6.4 oz (68.7 kg)   LMP 04/07/1997   SpO2 100%   BMI 27.69 kg/m       Objective:   Physical Exam  General  Mental Status - Alert. General Appearance - Well groomed. Not in acute distress.  Skin Rashes- No Rashes.  HEENT Head- Normal. Ear Auditory Canal - Left- Normal. Right - Normal.Tympanic Membrane- Left- Normal. Right- Normal. Eye Sclera/Conjunctiva- Left- Normal. Right- Normal. Nose & Sinuses Nasal Mucosa- Left-  Boggy and Congested. Right-  Boggy and  Congested.Bilateral maxillary but no frontal sinus pressure. Mouth & Throat Lips: Upper Lip- Normal: no dryness, cracking, pallor, cyanosis, or vesicular eruption. Lower Lip-Normal: no dryness, cracking,  pallor, cyanosis or vesicular eruption. Buccal Mucosa- Bilateral- No Aphthous ulcers. Oropharynx- No Discharge or Erythema. Tonsils: Characteristics- Bilateral- No Erythema or Congestion. Size/Enlargement- Bilateral- No enlargement. Discharge- bilateral-None.  Neck Neck- Supple. No Masses.   Chest and Lung Exam Auscultation: Breath Sounds:-Clear even and unlabored.  Cardiovascular Auscultation:Rythm- Regular, rate and rhythm. Murmurs & Other Heart Sounds:Ausculatation of the heart reveal- No  Murmurs.  Lymphatic Head & Neck General Head & Neck Lymphatics: Bilateral: Description- No Localized lymphadenopathy.       Assessment & Plan:    You appear to have bronchitis and sinusitis following flu like syndrome(probable secondary infections). Rest hydrate and tylenol for fever. Offered cough med but decided to use otc cough meds. Will rx azithromycin  antibiotic. For your nasal congestion you could use otc the counter nasal steroid.   Pt states zpack usually works for her. So decided to rx this instead of doxycycline  You should gradually get better. If not then notify us and would recommend a chest xray.  Follow up in 7-10 days or as needed  [employer], [employer]

## 2018-05-05 NOTE — Telephone Encounter (Signed)
I recommend re-evaluation in the office first please. Chest x-ray was negative for pneumonia at the time of her appointment.

## 2018-05-05 NOTE — Patient Instructions (Addendum)
You appear to have bronchitis and sinusitis following flu like syndrome(probable secondary infections). Rest hydrate and tylenol for fever. Offered cough med but decided to use otc cough meds. Will rx azithromycin  antibiotic. For your nasal congestion you could use otc the counter nasal steroid.   You should gradually get better. If not then notify us and would recommend a chest xray.  Follow up in 7-10 days or as needed

## 2018-05-05 NOTE — Telephone Encounter (Signed)
Pt stated she was diagnosed with the flu and requests a prescription for a z-pak. Pt requests Rx to be sent to [id number] [place], [place] [address]   Last OV:04/29/18

## 2018-05-05 NOTE — Telephone Encounter (Signed)
Call returned to patient. Left VM to return call to office. 

## 2018-05-05 NOTE — Telephone Encounter (Signed)
Patient scheduled to see Percell Miller today at 1:20 pm

## 2018-05-11 ENCOUNTER — Other Ambulatory Visit: Payer: Self-pay | Admitting: Family

## 2018-05-14 ENCOUNTER — Other Ambulatory Visit: Payer: Self-pay | Admitting: Family

## 2018-05-14 ENCOUNTER — Ambulatory Visit (INDEPENDENT_AMBULATORY_CARE_PROVIDER_SITE_OTHER): Payer: Medicare Other | Admitting: Medical

## 2018-05-14 ENCOUNTER — Encounter: Payer: Self-pay | Admitting: Medical

## 2018-05-14 VITALS — BP 138/89 | HR 70 | Temp 98.5°F | Resp 16 | Ht 62.0 in | Wt 151.6 lb

## 2018-05-14 DIAGNOSIS — R062 Wheezing: Secondary | ICD-10-CM | POA: Diagnosis not present

## 2018-05-14 DIAGNOSIS — R05 Cough: Secondary | ICD-10-CM | POA: Diagnosis not present

## 2018-05-14 DIAGNOSIS — R059 Cough, unspecified: Secondary | ICD-10-CM

## 2018-05-14 MED ORDER — PREDNISONE 10 MG PO TABS: ORAL_TABLET | ORAL | 0 refills | Status: DC

## 2018-05-14 MED ORDER — ALBUTEROL SULFATE HFA 108 (90 BASE) MCG/ACT IN AERS: INHALATION_SPRAY | RESPIRATORY_TRACT | 2 refills | Status: DC

## 2018-05-14 NOTE — Progress Notes (Signed)
Subjective:    Patient ID: Anna Peterson, female    DOB: [birth year], 68 y.o.   MRN: [id number]  HPI  Pt in for follow up.  Pt states she did start to feel better with z-pack. Treated for bronchitis and sinusitis. She states some residual cough and has some wheezing when she coughs/on expiration.  Cough is minimal.  She feels some moderate persisting fatigue. No fever, no chills or sweats.  Pt has old inhaler and she states it is expired. She used inhaler recently  just about one time a day.  Pt states she is about 90% better.     Review of Systems  Constitutional: Positive for fatigue. Negative for chills and fever.       Fatigue came along with recent illness.  HENT: Negative for congestion and ear discharge.   Respiratory: Positive for cough and wheezing. Negative for chest tightness and shortness of breath.   Cardiovascular: Negative for chest pain and palpitations.  Gastrointestinal: Negative for abdominal pain.  Musculoskeletal: Negative for gait problem and neck pain.  Skin: Negative for rash.  Hematological: Negative for adenopathy. Does not bruise/bleed easily.  Psychiatric/Behavioral: Negative for behavioral problems and decreased concentration. The patient is not nervous/anxious.      Past Medical History:  Diagnosis Date  . Allergy   . Asthma   . History of chicken pox   . Hyperlipidemia   . Migraine   . OSA (obstructive sleep apnea) 11/10/2017   Severe per home study 7/19  . OSA (obstructive sleep apnea) 11/10/2017  . Osteopenia    Osteopenia per Dexa Scan     Social History   Socioeconomic History  . Marital status: Married    Spouse name: Not on file  . Number of children: Not on file  . Years of education: Not on file  . Highest education level: Not on file  Occupational History  . Not on file  Social Needs  . Financial resource strain: Not on file  . Food insecurity:    Worry: Not on file    Inability: Not on file  . Transportation needs:      Medical: Not on file    Non-medical: Not on file  Tobacco Use  . Smoking status: Former Smoker    Types: Cigarettes    Last attempt to quit: 12/06/1970    Years since quitting: 47.4  . Smokeless tobacco: Never Used  Substance and Sexual Activity  . Alcohol use: Yes    Alcohol/week: 5.0 standard drinks    Types: 5 Glasses of wine per week  . Drug use: No  . Sexual activity: Not on file  Lifestyle  . Physical activity:    Days per week: Not on file    Minutes per session: Not on file  . Stress: Not on file  Relationships  . Social connections:    Talks on phone: Not on file    Gets together: Not on file    Attends religious service: Not on file    Active member of club or organization: Not on file    Attends meetings of clubs or organizations: Not on file    Relationship status: Not on file  . Intimate partner violence:    Fear of current or ex partner: Not on file    Emotionally abused: Not on file    Physically abused: Not on file    Forced sexual activity: Not on file  Other Topics Concern  . 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  Live in 4 different states.  Oldest son Corene Cornea lives locally. Second son Shanon Brow- lives in [place], daughter lives in [place], Dixon lives on [place]   No pets   Married for 43 years.      Past Surgical History:  Procedure Laterality Date  . CESAREAN SECTION    . KNEE SURGERY Left 2007   meniscus repair    Family History  Problem Relation Age of Onset  . Aneurysm Mother 29       Cerebral age 37  . Pancreatic cancer Maternal Grandmother   . Arthritis Maternal Grandmother   . Stroke Maternal Grandfather 83  . Arthritis Maternal Grandfather   . Breast cancer Paternal Grandmother 8  . Arthritis Paternal Grandmother    . Arthritis Paternal Grandfather     Allergies  Allergen Reactions  . Cabbage     "Migraine"  . Other Swelling    Peaches  . Ceclor [Cefaclor] Rash  . Latex Rash    Current Outpatient Medications on File Prior to Visit  Medication Sig Dispense Refill  . azithromycin (ZITHROMAX) 250 MG tablet Take 2 tablets by mouth on day 1, followed by 1 tablet by mouth daily for 4 days. 6 tablet 0  . fluticasone (FLONASE) 50 MCG/ACT nasal spray Place 1 spray into both nostrils daily as needed for allergies or rhinitis.    . Garlic 3875 MG CAPS Take 1 capsule by mouth daily.    Marland Kitchen loratadine (CLARITIN) 10 MG tablet Take 10 mg by mouth daily as needed for allergies.    . Multiple Vitamins-Minerals (MULTIVITAL PO) Take 1 tablet by mouth daily. MNS3. (metabolic nutrition system)    . Omega-3 Fatty Acids (FISH OIL) 1200 MG CAPS Take 1,200 mg by mouth 2 (two) times daily.    Marland Kitchen oseltamivir (TAMIFLU) 75 MG capsule Take 1 capsule (75 mg total) by mouth 2 (two) times daily. 10 capsule 0  . SUMAtriptan (IMITREX) 100 MG tablet Take 1 tablet (100 mg total) by mouth every 2 (two) hours as needed for migraine. May repeat in 2 hours if headache persists or recurs. 10 tablet 1   No current facility-administered medications on file prior to visit.     BP 138/89   Pulse 70   Temp 98.5 F (36.9 C) (Oral)   Resp 16   Ht 5\' 2"  (1.575 m)   Wt 151 lb 9.6 oz (68.8 kg)   LMP 04/07/1997   SpO2 100%   BMI 27.73 kg/m       Objective:   Physical Exam  General  Mental Status - Alert. General Appearance - Well groomed. Not in acute distress.  Skin Rashes- No Rashes.  HEENT Head- Normal. Ear Auditory Canal - Left- Normal. Right - Normal.Tympanic Membrane- Left- Normal. Right- Normal. Eye Sclera/Conjunctiva- Left- Normal. Right- Normal. Nose & Sinuses Nasal Mucosa- Left-  Boggy and Congested. Right-  Boggy and  Congested.Bilateral no  maxillary and no  frontal sinus pressure. Mouth & Throat Lips: Upper  Lip- Normal: no dryness, cracking, pallor, cyanosis, or vesicular eruption. Lower Lip-Normal: no dryness, cracking, pallor, cyanosis or vesicular eruption. Buccal Mucosa- Bilateral- No Aphthous ulcers. Oropharynx- No Discharge or Erythema. Tonsils: Characteristics- Bilateral- No Erythema or Congestion. Size/Enlargement- Bilateral-  No enlargement. Discharge- bilateral-None.  Neck Neck- Supple. No Masses.   Chest and Lung Exam Auscultation: Breath Sounds:-Clear even and unlabored.  Cardiovascular Auscultation:Rythm- Regular, rate and rhythm. Murmurs & Other Heart Sounds:Ausculatation of the heart reveal- No Murmurs.  Lymphatic Head & Neck General Head & Neck Lymphatics: Bilateral: Description- No Localized lymphadenopathy.       Assessment & Plan:  Your bronchitis and sinus infection symptoms appear resolved. 90% improvement. Mild fatigue and residual cough with wheeze.  Can continue albuterol if needed. If wheezing persists by Monday or Tuesday can start taper oral prednisone.   Expect you to be better by next week. If still cough or wheeze then notify me and will get chest xray.  Follow up as regularly scheduled with pcp or as needed  25 minutes spent with pt. 50% of time spent with pt counseling on plan going forward explaining plan going forward and reasoning behind potentially getting cxr.  Mackie Pai, PA-C

## 2018-05-14 NOTE — Patient Instructions (Signed)
Your bronchitis and sinus infection symptoms appear resolved. 90% improvement. Mild fatigue and residual cough with wheeze.  Can continue albuterol if needed. If wheezing persists by Monday or Tuesday can start taper oral prednisone.   Expect you to be better by next week. If still cough or wheeze then notify me and will get chest xray.  Follow up as regularly scheduled with pcp or as needed

## 2018-06-22 ENCOUNTER — Telehealth: Payer: Self-pay | Admitting: Pulmonary Disease

## 2018-06-22 NOTE — Telephone Encounter (Signed)
Can go ahead and cancel  Reschedule at patients convenience

## 2018-06-22 NOTE — Telephone Encounter (Signed)
Called and spoke with patient, she is aware and will call back to reschedule. Nothing further needed.

## 2018-06-22 NOTE — Telephone Encounter (Signed)
Instructions   Obstructive sleep apnea Excellent compliance with treatment with improvement in residual AHI  Continue current treatment  I will see you in about 4 to 5 months Call with significant symptoms     ________________________________  Hulen Skains and spoke with patient she stated that she has an appointment on 07/02/18 for her readings on her CPAP machine. She is wanting to cancel this appointment for now.   AO please advise if this is ok. Thank you.

## 2018-07-02 ENCOUNTER — Ambulatory Visit: Payer: Medicare Other | Admitting: Pulmonary Disease

## 2018-07-30 ENCOUNTER — Ambulatory Visit: Payer: Medicare Other | Admitting: Pulmonary Disease

## 2018-10-01 ENCOUNTER — Ambulatory Visit (INDEPENDENT_AMBULATORY_CARE_PROVIDER_SITE_OTHER): Payer: Medicare Other | Admitting: Family

## 2018-10-01 ENCOUNTER — Other Ambulatory Visit: Payer: Self-pay

## 2018-10-01 ENCOUNTER — Encounter: Payer: Self-pay | Admitting: Family

## 2018-10-01 VITALS — BP 139/83 | HR 66 | Temp 98.6°F | Resp 16 | Ht 62.5 in | Wt 152.4 lb

## 2018-10-01 DIAGNOSIS — Z0000 Encounter for general adult medical examination without abnormal findings: Secondary | ICD-10-CM | POA: Diagnosis not present

## 2018-10-01 DIAGNOSIS — E785 Hyperlipidemia, unspecified: Secondary | ICD-10-CM

## 2018-10-01 DIAGNOSIS — Z23 Encounter for immunization: Secondary | ICD-10-CM | POA: Diagnosis not present

## 2018-10-01 DIAGNOSIS — Z1159 Encounter for screening for other viral diseases: Secondary | ICD-10-CM

## 2018-10-01 LAB — COMPREHENSIVE METABOLIC PANEL
ALT: 21 U/L (ref 0–35)
AST: 21 U/L (ref 0–37)
Albumin: 4.6 g/dL (ref 3.5–5.2)
Alkaline Phosphatase: 60 U/L (ref 39–117)
BUN: 12 mg/dL (ref 6–23)
CO2: 27 mEq/L (ref 19–32)
Calcium: 9.7 mg/dL (ref 8.4–10.5)
Chloride: 104 mEq/L (ref 96–112)
Creatinine, Ser: 0.72 mg/dL (ref 0.40–1.20)
GFR: 80.75 mL/min (ref >60.00)
Glucose, Bld: 95 mg/dL (ref 70–99)
Potassium: 4.4 mEq/L (ref 3.5–5.1)
Sodium: 140 mEq/L (ref 135–145)
Total Bilirubin: 0.6 mg/dL (ref 0.2–1.2)
Total Protein: 7.1 g/dL (ref 6.0–8.3)

## 2018-10-01 LAB — LIPID PANEL
Cholesterol: 226 mg/dL — ABNORMAL HIGH (ref 0–200)
HDL: 57.8 mg/dL (ref >39.00)
LDL Cholesterol: 154 mg/dL — ABNORMAL HIGH (ref 0–99)
NonHDL: 167.84
Total CHOL/HDL Ratio: 4
Triglycerides: 68 mg/dL (ref 0.0–149.0)
VLDL: 13.6 mg/dL (ref 0.0–40.0)

## 2018-10-01 NOTE — Patient Instructions (Signed)
Please continue healthy diet.  Try to increase your walking to 30 minutes 5 days a week. Schedule your mammogram in July.

## 2018-10-01 NOTE — Progress Notes (Signed)
Subjective:    Patient ID: Anna Peterson, female    DOB: [birth year], 68 y.o.   MRN: [id number]  HPI  Patient presents today for complete physical.  Immunizations: due for pneumovax, hep c screening Diet: reports that her diet is healthy Exercise:  Reports that she generally walks.  Has not been walking as much lately.  Colonoscopy: 01/19/17 Dexa: 2018 Pap Smear: N/A Mammogram: 7/19- patient will schedule Vision: up to date Dental:  Up to date  She was seen by ophthalmology on 3/17 due to new vitreous detachment OS. She denies any current vision problem  Reports that she had a migraine last week which was preceded by visual aura.   OSA- maintained on cpap and followed by Dr. Ander Slade. She reports that she has less headaches since starting the cpap.    Reports taht a few weeks ago she hurt her back.  Had some sciatic pain on the left. Saw chiropractor x 2 and has been starting to feel better.   Review of Systems  Constitutional: Negative for unexpected weight change.  HENT: Negative for hearing loss.   Eyes: Negative for visual disturbance.  Respiratory: Negative for cough and shortness of breath.   Cardiovascular: Negative for chest pain and leg swelling.  Gastrointestinal: Negative for blood in stool, constipation and diarrhea.  Genitourinary: Negative for dysuria, frequency and hematuria.  Musculoskeletal: Negative for arthralgias and myalgias.  Skin: Negative for rash.  Neurological:       Reports infrequent migraines- about 1x a month  Hematological: Negative for adenopathy.  Psychiatric/Behavioral:       Denies depression/anxiety      Past Medical History:  Diagnosis Date  . Allergy   . Asthma   . History of chicken pox   . Hyperlipidemia   . Migraine   . OSA (obstructive sleep apnea) 11/10/2017   Severe per home study 7/19  . OSA (obstructive sleep apnea) 11/10/2017  . Osteopenia    Osteopenia per Dexa Scan     Social History   Socioeconomic History  .  Marital status: Married    Spouse name: Not on file  . Number of children: Not on file  . Years of education: Not on file  . Highest education level: Not on file  Occupational History  . Not on file  Social Needs  . Financial resource strain: Not on file  . Food insecurity    Worry: Not on file    Inability: Not on file  . Transportation needs    Medical: Not on file    Non-medical: Not on file  Tobacco Use  . Smoking status: Former Smoker    Types: Cigarettes    Quit date: 12/06/1970    Years since quitting: 47.8  . Smokeless tobacco: Never Used  Substance and Sexual Activity  . Alcohol use: Yes    Alcohol/week: 5.0 standard drinks    Types: 5 Glasses of wine per week  . Drug use: No  . Sexual activity: Not on file  Lifestyle  . Physical activity    Days per week: Not on file    Minutes per session: Not on file  . Stress: Not on file  Relationships  . Social Herbalist on phone: Not on file    Gets together: Not on file    Attends religious service: Not on file    Active member of club or organization: Not on file    Attends meetings of clubs or organizations: Not on  file    Relationship status: Not on file  . Intimate partner violence    Fear of current or ex partner: Not on file    Emotionally abused: Not on file    Physically abused: Not on file    Forced sexual activity: Not on file  Other Topics Concern  . 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  Live in 4 different states.  Oldest son Corene Cornea lives locally. Second son Shanon Brow- lives in [place], daughter lives in [place], Gratz lives on [place]   No pets   Married for 43 years.      Past Surgical History:  Procedure Laterality Date  . CESAREAN SECTION    . KNEE SURGERY Left 2007    meniscus repair    Family History  Problem Relation Age of Onset  . Aneurysm Mother 11       Cerebral age 1  . Pancreatic cancer Maternal Grandmother   . Arthritis Maternal Grandmother   . Stroke Maternal Grandfather 12  . Arthritis Maternal Grandfather   . Breast cancer Paternal Grandmother 30  . Arthritis Paternal Grandmother   . Arthritis Paternal Grandfather     Allergies  Allergen Reactions  . Cabbage     "Migraine"  . Other Swelling    Peaches  . Ceclor [Cefaclor] Rash  . Latex Rash    Current Outpatient Medications on File Prior to Visit  Medication Sig Dispense Refill  . albuterol (PROAIR HFA) 108 (90 Base) MCG/ACT inhaler TAKE 2 PUFFS BY MOUTH EVERY 6 HOURS AS NEEDED FOR WHEEZE 8.5 Inhaler 2  . fluticasone (FLONASE) 50 MCG/ACT nasal spray Place 1 spray into both nostrils daily as needed for allergies or rhinitis.    . Garlic 7253 MG CAPS Take 1 capsule by mouth daily.    Marland Kitchen loratadine (CLARITIN) 10 MG tablet Take 10 mg by mouth daily as needed for allergies.    . Multiple Vitamins-Minerals (MULTIVITAL PO) Take 1 tablet by mouth daily. MNS3. (metabolic nutrition system)    . Omega-3 Fatty Acids (FISH OIL) 1200 MG CAPS Take 1,200 mg by mouth 2 (two) times daily.    . SUMAtriptan (IMITREX) 100 MG tablet Take 1 tablet (100 mg total) by mouth every 2 (two) hours as needed for migraine. May repeat in 2 hours if headache persists or recurs. 10 tablet 1   No current facility-administered medications on file prior to visit.     BP 139/83 (BP Location: Right Arm, Patient Position: Sitting, Cuff Size: Small)   Pulse 66   Temp 98.6 F (37 C) (Oral)   Resp 16   Ht 5' 2.5" (1.588 m)   Wt 152 lb 6.4 oz (69.1 kg)   LMP 04/07/1997   SpO2 100%   BMI 27.43 kg/m       Objective:   Physical Exam  Physical Exam  Constitutional: She is oriented to person, place, and time. She appears well-developed and well-nourished. No distress.  HENT:  Head: Normocephalic and  atraumatic.  Right Ear: Tympanic membrane and ear canal normal.  Left Ear: Tympanic membrane and ear canal normal.  Mouth/Throat: deferred. Pt wearing mask Eyes: Pupils are equal, round, and reactive to light. No scleral icterus.  Neck: Normal range of  motion. No thyromegaly present.  Cardiovascular: Normal rate and regular rhythm.   No murmur heard. Pulmonary/Chest: Effort normal and breath sounds normal. No respiratory distress. He has no wheezes. She has no rales. She exhibits no tenderness.  Abdominal: Soft. Bowel sounds are normal. She exhibits no distension and no mass. There is no tenderness. There is no rebound and no guarding.  Musculoskeletal: She exhibits no edema.  Lymphadenopathy:    She has no cervical adenopathy.  Neurological: She is alert and oriented to person, place, and time. She has normal patellar reflexes. She exhibits normal muscle tone. Coordination normal.  Skin: Skin is warm and dry.  Psychiatric: She has a normal mood and affect. Her behavior is normal. Judgment and thought content normal.  Breasts: Examined lying Right: Without masses, retractions, discharge or axillary adenopathy.  Left: Without masses, retractions, discharge or axillary adenopathy.  Pelvic: deferred           Assessment & Plan:   Preventative care- discussed increasing walking to 30 minutes 5 days a week.  Continue healthy diet. Reminded pt to schedule mammogram in July. Pneumovax 23 today. Obtain routine lab work.       Assessment & Plan:

## 2018-10-04 ENCOUNTER — Encounter: Payer: Self-pay | Admitting: Family

## 2018-10-04 LAB — HEPATITIS C ANTIBODY
Hepatitis C Ab: NONREACTIVE
SIGNAL TO CUT-OFF: 0.02 (ref <1.00)

## 2018-11-01 ENCOUNTER — Encounter: Payer: Self-pay | Admitting: Family

## 2018-11-01 LAB — HM MAMMOGRAPHY

## 2018-11-12 ENCOUNTER — Telehealth: Payer: Self-pay | Admitting: Family

## 2018-11-12 NOTE — Telephone Encounter (Signed)
Received orders for cpap from [facility] on this pt. Please contact them and ask them to send these order forms to her sleep specialist- Dr. Ander Slade.

## 2018-11-15 ENCOUNTER — Encounter: Payer: Self-pay | Admitting: Pulmonary Disease

## 2018-11-15 ENCOUNTER — Other Ambulatory Visit: Payer: Self-pay

## 2018-11-15 ENCOUNTER — Telehealth: Payer: Self-pay | Admitting: Family

## 2018-11-15 ENCOUNTER — Ambulatory Visit (INDEPENDENT_AMBULATORY_CARE_PROVIDER_SITE_OTHER): Payer: Medicare Other | Admitting: Pulmonary Disease

## 2018-11-15 DIAGNOSIS — G4733 Obstructive sleep apnea (adult) (pediatric): Secondary | ICD-10-CM | POA: Diagnosis not present

## 2018-11-15 DIAGNOSIS — Z9989 Dependence on other enabling machines and devices: Secondary | ICD-10-CM | POA: Diagnosis not present

## 2018-11-15 DIAGNOSIS — M8580 Other specified disorders of bone density and structure, unspecified site: Secondary | ICD-10-CM

## 2018-11-15 MED ORDER — CALCIUM CARBONATE-VITAMIN D 600-400 MG-UNIT PO CHEW: 1.0000 | CHEWABLE_TABLET | Freq: Every day | ORAL | Status: AC

## 2018-11-15 MED ORDER — ALENDRONATE SODIUM 70 MG PO TABS: 70.0000 mg | ORAL_TABLET | ORAL | 3 refills | Status: DC

## 2018-11-15 NOTE — Telephone Encounter (Signed)
Patient notified of results and  stated that she will try Fosamax.  Rx sent in.

## 2018-11-15 NOTE — Addendum Note (Signed)
Addended by: Hildred Alamin I on: 11/15/2018 11:42 AM   Modules accepted: Orders

## 2018-11-15 NOTE — Telephone Encounter (Signed)
Called and info given to [place].

## 2018-11-15 NOTE — Progress Notes (Signed)
.  Virtual Visit via Telephone Note  I connected with Taja Pentland on 11/15/18 at 10:30 AM EDT by telephone and verified that I am speaking with the correct person using two identifiers.  Location: Patient: [facility] Provider: Adrian Prince discussed the limitations, risks, security and privacy concerns of performing an evaluation and management service by telephone and the availability of in person appointments. I also discussed with the patient that there may be a patient responsible charge related to this service. The patient expressed understanding and agreed to proceed.   History of Present Illness: Patient with obstructive sleep apnea Continues to use CPAP on a regular basis Occasional dryness of her mouth   Observations/Objective: Compliance report reveals 100% compliance AHI of 7.2 Improved from previous Occasional leaks  Assessment and Plan: Obstructive sleep apnea She wants to try different mask  We will send a request to DME company  Follow Up Instructions:  Request the DME company for trial with a different mask/interface Continue using CPAP  Visit again in about 3 months   I discussed the assessment and treatment plan with the patient. The patient was provided an opportunity to ask questions and all were answered. The patient agreed with the plan and demonstrated an understanding of the instructions.   The patient was advised to call back or seek an in-person evaluation if the symptoms worsen or if the condition fails to improve as anticipated.  I provided 12 minutes of non-face-to-face time during this encounter.   Laurin Coder, MD

## 2018-11-15 NOTE — Patient Instructions (Signed)
Obstructive sleep apnea Download does reveal adequate control  DME referral for CPAP supplies-trial with different mask  Will visit again in 3 months

## 2018-11-15 NOTE — Telephone Encounter (Signed)
Please let pt know that her bone density has worsened.  I would recommend that she add fosamax 70mg  PO once weekly #12 with 3 refills. Please send if she is agreeable. Will need to sit upright for 90 minutes after taking dose.  Add  caltrate 600mg  + D twice daily and regular weight bearing exercise such as walking. She should plan to repeat Bone density in 2 years.   Also- I would like her to return to the lab at her convenience for a vit D level- please schedule.

## 2018-11-15 NOTE — Telephone Encounter (Signed)
Called and info given to [place].

## 2018-11-17 ENCOUNTER — Other Ambulatory Visit: Payer: Self-pay | Admitting: *Deleted

## 2018-11-17 NOTE — Telephone Encounter (Signed)
Noted  

## 2018-11-17 NOTE — Telephone Encounter (Signed)
Pt wants to know what the diagnosis from her recent bone density scan was.

## 2018-11-17 NOTE — Telephone Encounter (Signed)
Advised patient that to order the test we used osteopenia.    She wanted to let Melissa know that she will not do the Fosamax at this time and she will just increase her walking and weight bearing exercises.   Fosamax d/c on med list.

## 2018-11-24 ENCOUNTER — Telehealth: Payer: Self-pay

## 2018-11-24 DIAGNOSIS — M8580 Other specified disorders of bone density and structure, unspecified site: Secondary | ICD-10-CM

## 2018-11-24 NOTE — Telephone Encounter (Signed)
Copied from [place] [id number]. Topic: General - Inquiry >> Nov 24, 2018 11:46 AM Richardo Priest, NT wrote: Reason for CRM: Patient called in stating that she would like a magnesium, full thyroid, and parathyroid order to be added to her appointment this upcoming Friday. Patient would like this done as she is already getting blood drawn. Please advise as patient stated if insurance does not cover it, she will pay out of pocket for it. Call back is [phone].

## 2018-11-25 NOTE — Telephone Encounter (Signed)
I will order at her appointment.

## 2018-11-26 ENCOUNTER — Other Ambulatory Visit: Payer: Self-pay

## 2018-11-26 ENCOUNTER — Other Ambulatory Visit (INDEPENDENT_AMBULATORY_CARE_PROVIDER_SITE_OTHER): Payer: Medicare Other

## 2018-11-26 DIAGNOSIS — M8580 Other specified disorders of bone density and structure, unspecified site: Secondary | ICD-10-CM

## 2018-11-26 NOTE — Addendum Note (Signed)
Addended by: Caffie Pinto on: 11/26/2018 01:48 PM   Modules accepted: Orders

## 2018-11-26 NOTE — Telephone Encounter (Signed)
See order(s).

## 2018-11-26 NOTE — Telephone Encounter (Signed)
Patient was here for labs only, Anna Peterson collected xtra tube for possible thyroid, parathyroid, and Magnesium labs.  Please enter lab orders.  It will all need to be order to go to [facility].

## 2018-11-26 NOTE — Addendum Note (Signed)
Addended by: Caffie Pinto on: 11/26/2018 01:49 PM   Modules accepted: Orders

## 2018-11-27 LAB — TSH: TSH: 1.67 mIU/L (ref 0.40–4.50)

## 2018-11-27 LAB — MAGNESIUM: Magnesium: 2.3 mg/dL (ref 1.5–2.5)

## 2018-11-29 LAB — VITAMIN D 1,25 DIHYDROXY
Vitamin D 1, 25 (OH)2 Total: 64 pg/mL (ref 18–72)
Vitamin D2 1, 25 (OH)2: <8 pg/mL
Vitamin D3 1, 25 (OH)2: 64 pg/mL

## 2018-11-30 LAB — PTH, INTACT AND CALCIUM
Calcium: 10 mg/dL (ref 8.6–10.4)
PTH: 58 pg/mL (ref 14–64)

## 2018-12-20 ENCOUNTER — Telehealth: Payer: Self-pay | Admitting: Family

## 2018-12-20 NOTE — Telephone Encounter (Signed)
Pt would like to have her most recent labs sent to a different provider, Chalmers Guest NP at: Phone: [phone] her address is [address] [facility]. Pt would like to have them faxed, pt will call back with fax number to have them faxed.

## 2018-12-21 NOTE — Telephone Encounter (Signed)
Patient returning call with fax number:   # [phone] [facility]  Anna Peterson

## 2018-12-23 NOTE — Telephone Encounter (Signed)
Patient requesting copy of bone density test for a "naturalist that she is seeing", she has copy of her labs from [facility]. Copy of bone density test mailed out to patient.  Advised we can not forward records to another doctor unless we initiated a referral.

## 2019-04-29 ENCOUNTER — Encounter: Payer: Self-pay | Admitting: Family

## 2019-05-02 ENCOUNTER — Encounter: Payer: Self-pay | Admitting: Family

## 2019-05-03 ENCOUNTER — Encounter: Payer: Self-pay | Admitting: Family

## 2019-05-10 DIAGNOSIS — M9904 Segmental and somatic dysfunction of sacral region: Secondary | ICD-10-CM | POA: Diagnosis not present

## 2019-05-10 DIAGNOSIS — M9903 Segmental and somatic dysfunction of lumbar region: Secondary | ICD-10-CM | POA: Diagnosis not present

## 2019-05-10 DIAGNOSIS — M9905 Segmental and somatic dysfunction of pelvic region: Secondary | ICD-10-CM | POA: Diagnosis not present

## 2019-05-10 DIAGNOSIS — M62830 Muscle spasm of back: Secondary | ICD-10-CM | POA: Diagnosis not present

## 2019-05-11 DIAGNOSIS — E782 Mixed hyperlipidemia: Secondary | ICD-10-CM | POA: Diagnosis not present

## 2019-05-31 DIAGNOSIS — M9905 Segmental and somatic dysfunction of pelvic region: Secondary | ICD-10-CM | POA: Diagnosis not present

## 2019-05-31 DIAGNOSIS — M9904 Segmental and somatic dysfunction of sacral region: Secondary | ICD-10-CM | POA: Diagnosis not present

## 2019-05-31 DIAGNOSIS — M62830 Muscle spasm of back: Secondary | ICD-10-CM | POA: Diagnosis not present

## 2019-05-31 DIAGNOSIS — M9903 Segmental and somatic dysfunction of lumbar region: Secondary | ICD-10-CM | POA: Diagnosis not present

## 2019-06-07 DIAGNOSIS — G4733 Obstructive sleep apnea (adult) (pediatric): Secondary | ICD-10-CM | POA: Diagnosis not present

## 2019-06-24 DIAGNOSIS — H5371 Glare sensitivity: Secondary | ICD-10-CM | POA: Diagnosis not present

## 2019-06-24 DIAGNOSIS — H524 Presbyopia: Secondary | ICD-10-CM | POA: Diagnosis not present

## 2019-06-24 DIAGNOSIS — H35372 Puckering of macula, left eye: Secondary | ICD-10-CM | POA: Diagnosis not present

## 2019-06-24 DIAGNOSIS — H5213 Myopia, bilateral: Secondary | ICD-10-CM | POA: Diagnosis not present

## 2019-06-24 DIAGNOSIS — H2513 Age-related nuclear cataract, bilateral: Secondary | ICD-10-CM | POA: Diagnosis not present

## 2019-07-05 DIAGNOSIS — M62830 Muscle spasm of back: Secondary | ICD-10-CM | POA: Diagnosis not present

## 2019-07-05 DIAGNOSIS — M9905 Segmental and somatic dysfunction of pelvic region: Secondary | ICD-10-CM | POA: Diagnosis not present

## 2019-07-05 DIAGNOSIS — M9903 Segmental and somatic dysfunction of lumbar region: Secondary | ICD-10-CM | POA: Diagnosis not present

## 2019-07-05 DIAGNOSIS — M9904 Segmental and somatic dysfunction of sacral region: Secondary | ICD-10-CM | POA: Diagnosis not present

## 2019-07-26 DIAGNOSIS — M9903 Segmental and somatic dysfunction of lumbar region: Secondary | ICD-10-CM | POA: Diagnosis not present

## 2019-07-26 DIAGNOSIS — M9904 Segmental and somatic dysfunction of sacral region: Secondary | ICD-10-CM | POA: Diagnosis not present

## 2019-07-26 DIAGNOSIS — M9905 Segmental and somatic dysfunction of pelvic region: Secondary | ICD-10-CM | POA: Diagnosis not present

## 2019-07-26 DIAGNOSIS — M62830 Muscle spasm of back: Secondary | ICD-10-CM | POA: Diagnosis not present

## 2019-08-08 ENCOUNTER — Telehealth: Payer: Self-pay | Admitting: Family

## 2019-08-30 DIAGNOSIS — M9904 Segmental and somatic dysfunction of sacral region: Secondary | ICD-10-CM | POA: Diagnosis not present

## 2019-08-30 DIAGNOSIS — M9905 Segmental and somatic dysfunction of pelvic region: Secondary | ICD-10-CM | POA: Diagnosis not present

## 2019-08-30 DIAGNOSIS — M62830 Muscle spasm of back: Secondary | ICD-10-CM | POA: Diagnosis not present

## 2019-08-30 DIAGNOSIS — M9903 Segmental and somatic dysfunction of lumbar region: Secondary | ICD-10-CM | POA: Diagnosis not present

## 2019-08-31 ENCOUNTER — Encounter: Payer: Self-pay | Admitting: Family

## 2019-08-31 DIAGNOSIS — E559 Vitamin D deficiency, unspecified: Secondary | ICD-10-CM | POA: Diagnosis not present

## 2019-08-31 DIAGNOSIS — E782 Mixed hyperlipidemia: Secondary | ICD-10-CM | POA: Diagnosis not present

## 2019-09-05 DIAGNOSIS — G4733 Obstructive sleep apnea (adult) (pediatric): Secondary | ICD-10-CM | POA: Diagnosis not present

## 2019-09-14 DIAGNOSIS — H35362 Drusen (degenerative) of macula, left eye: Secondary | ICD-10-CM | POA: Diagnosis not present

## 2019-09-14 DIAGNOSIS — H5315 Visual distortions of shape and size: Secondary | ICD-10-CM | POA: Diagnosis not present

## 2019-09-14 DIAGNOSIS — H2513 Age-related nuclear cataract, bilateral: Secondary | ICD-10-CM | POA: Diagnosis not present

## 2019-09-14 DIAGNOSIS — H35373 Puckering of macula, bilateral: Secondary | ICD-10-CM | POA: Diagnosis not present

## 2019-09-26 DIAGNOSIS — M9904 Segmental and somatic dysfunction of sacral region: Secondary | ICD-10-CM | POA: Diagnosis not present

## 2019-09-26 DIAGNOSIS — M62830 Muscle spasm of back: Secondary | ICD-10-CM | POA: Diagnosis not present

## 2019-09-26 DIAGNOSIS — M9903 Segmental and somatic dysfunction of lumbar region: Secondary | ICD-10-CM | POA: Diagnosis not present

## 2019-09-26 DIAGNOSIS — M9905 Segmental and somatic dysfunction of pelvic region: Secondary | ICD-10-CM | POA: Diagnosis not present

## 2019-10-03 ENCOUNTER — Ambulatory Visit (INDEPENDENT_AMBULATORY_CARE_PROVIDER_SITE_OTHER): Payer: Medicare Other | Admitting: Family

## 2019-10-03 ENCOUNTER — Other Ambulatory Visit: Payer: Self-pay

## 2019-10-03 ENCOUNTER — Telehealth: Payer: Self-pay | Admitting: Family

## 2019-10-03 ENCOUNTER — Encounter: Payer: Self-pay | Admitting: Family

## 2019-10-03 VITALS — BP 128/74 | HR 62 | Temp 97.4°F | Resp 16 | Ht 62.5 in | Wt 150.0 lb

## 2019-10-03 DIAGNOSIS — R0789 Other chest pain: Secondary | ICD-10-CM | POA: Diagnosis not present

## 2019-10-03 DIAGNOSIS — Z0001 Encounter for general adult medical examination with abnormal findings: Secondary | ICD-10-CM

## 2019-10-03 DIAGNOSIS — Z0000 Encounter for general adult medical examination without abnormal findings: Secondary | ICD-10-CM

## 2019-10-03 MED ORDER — SHINGRIX 50 MCG/0.5ML IM SUSR: INTRAMUSCULAR | 1 refills | Status: DC

## 2019-10-03 MED ORDER — TETANUS-DIPHTHERIA TOXOIDS TD 2-2 LF/0.5ML IM SUSP: 0.5000 mL | Freq: Once | INTRAMUSCULAR | 0 refills | Status: AC

## 2019-10-03 NOTE — Patient Instructions (Signed)
Preventive Care 38 Years and Older, Female Preventive care refers to lifestyle choices and visits with your health care provider that can promote health and wellness. This includes:  A yearly physical exam. This is also called an annual well check.  Regular dental and eye exams.  Immunizations.  Screening for certain conditions.  Healthy lifestyle choices, such as diet and exercise. What can I expect for my preventive care visit? Physical exam Your health care provider will check:  Height and weight. These may be used to calculate body mass index (BMI), which is a measurement that tells if you are at a healthy weight.  Heart rate and blood pressure.  Your skin for abnormal spots. Counseling Your health care provider may ask you questions about:  Alcohol, tobacco, and drug use.  Emotional well-being.  Home and relationship well-being.  Sexual activity.  Eating habits.  History of falls.  Memory and ability to understand (cognition).  Work and work Statistician.  Pregnancy and menstrual history. What immunizations do I need?  Influenza (flu) vaccine  This is recommended every year. Tetanus, diphtheria, and pertussis (Tdap) vaccine  You may need a Td booster every 10 years. Varicella (chickenpox) vaccine  You may need this vaccine if you have not already been vaccinated. Zoster (shingles) vaccine  You may need this after age 33. Pneumococcal conjugate (PCV13) vaccine  One dose is recommended after age 33. Pneumococcal polysaccharide (PPSV23) vaccine  One dose is recommended after age 72. Measles, mumps, and rubella (MMR) vaccine  You may need at least one dose of MMR if you were born in 1957 or later. You may also need a second dose. Meningococcal conjugate (MenACWY) vaccine  You may need this if you have certain conditions. Hepatitis A vaccine  You may need this if you have certain conditions or if you travel or work in places where you may be exposed  to hepatitis A. Hepatitis B vaccine  You may need this if you have certain conditions or if you travel or work in places where you may be exposed to hepatitis B. Haemophilus influenzae type b (Hib) vaccine  You may need this if you have certain conditions. You may receive vaccines as individual doses or as more than one vaccine together in one shot (combination vaccines). Talk with your health care provider about the risks and benefits of combination vaccines. What tests do I need? Blood tests  Lipid and cholesterol levels. These may be checked every 5 years, or more frequently depending on your overall health.  Hepatitis C test.  Hepatitis B test. Screening  Lung cancer screening. You may have this screening every year starting at age 39 if you have a 30-pack-year history of smoking and currently smoke or have quit within the past 15 years.  Colorectal cancer screening. All adults should have this screening starting at age 36 and continuing until age 15. Your health care provider may recommend screening at age 23 if you are at increased risk. You will have tests every 1-10 years, depending on your results and the type of screening test.  Diabetes screening. This is done by checking your blood sugar (glucose) after you have not eaten for a while (fasting). You may have this done every 1-3 years.  Mammogram. This may be done every 1-2 years. Talk with your health care provider about how often you should have regular mammograms.  BRCA-related cancer screening. This may be done if you have a family history of breast, ovarian, tubal, or peritoneal cancers.  Other tests  Sexually transmitted disease (STD) testing.  Bone density scan. This is done to screen for osteoporosis. You may have this done starting at age 44. Follow these instructions at home: Eating and drinking  Eat a diet that includes fresh fruits and vegetables, whole grains, lean protein, and low-fat dairy products. Limit  your intake of foods with high amounts of sugar, saturated fats, and salt.  Take vitamin and mineral supplements as recommended by your health care provider.  Do not drink alcohol if your health care provider tells you not to drink.  If you drink alcohol: ? Limit how much you have to 0-1 drink a day. ? Be aware of how much alcohol is in your drink. In the [place] one drink equals one 12 oz bottle of beer (355 mL), one 5 oz glass of wine (148 mL), or one 1 oz glass of hard liquor (44 mL). Lifestyle  Take daily care of your teeth and gums.  Stay active. Exercise for at least 30 minutes on 5 or more days each week.  Do not use any products that contain nicotine or tobacco, such as cigarettes, e-cigarettes, and chewing tobacco. If you need help quitting, ask your health care provider.  If you are sexually active, practice safe sex. Use a condom or other form of protection in order to prevent STIs (sexually transmitted infections).  Talk with your health care provider about taking a low-dose aspirin or statin. What's next?  Go to your health care provider once a year for a well check visit.  Ask your health care provider how often you should have your eyes and teeth checked.  Stay up to date on all vaccines. This information is not intended to replace advice given to you by your health care provider. Make sure you discuss any questions you have with your health care provider. Document Revised: 03/18/2018 Document Reviewed: 03/18/2018 Elsevier Patient Education  2020 [employer].

## 2019-10-03 NOTE — Progress Notes (Signed)
Subjective:    Patient ID: Anna Peterson, female    DOB: 1950-12-01, 69 y.o.   MRN: [id number]  HPI  Patient presents today for complete physical.  Immunizations: Pfizer up to date, tetanus due would like Shingrix #1 Diet:  Healthy "Mediterranean diet", mostly chicken and salmon, fresh salad Exercise:  Not as good during covid- [facility]  Starting back.  Colonoscopy:2018, due 2028 Dexa: 2020 Pap Smear: N/A Mammogram:11/01/18 Vision: 3/20 (reports that she has an irregularity in her retina, saw Dr. Amedeo Plenty retinal specialist on 6/9- was told she does not surgery right away. Dental: up to date Wt Readings from Last 3 Encounters:  10/03/19 150 lb (68 kg)  10/01/18 152 lb 6.4 oz (69.1 kg)  05/14/18 151 lb 9.6 oz (68.8 kg)   She reports that she had one day last week with chest heaviness. That day her husband was having surgery and her daughter had an endoscopy. After the day was over her chest heaviness resolved. She has had no further issues since that time.     Review of Systems  Constitutional: Negative for unexpected weight change.  HENT: Negative for hearing loss and rhinorrhea.   Eyes: Positive for visual disturbance (some blurring of vision in left eye centrally due to retinal issues).  Respiratory: Negative for cough and shortness of breath.   Cardiovascular: Positive for chest pain (see HPI).  Gastrointestinal: Negative for blood in stool, constipation and diarrhea.  Genitourinary: Negative for dysuria, frequency and hematuria.  Musculoskeletal: Negative for arthralgias and myalgias.  Skin: Negative for rash.  Neurological: Positive for headaches (much better since she started using her cpap).  Hematological: Negative for adenopathy.  Psychiatric/Behavioral:       Denies depression/anxiety   Past Medical History:  Diagnosis Date  . Allergy   . Asthma   . History of chicken pox   . Hyperlipidemia   . Migraine   . OSA (obstructive sleep apnea) 11/10/2017     Severe per home study 7/19  . OSA (obstructive sleep apnea) 11/10/2017  . Osteopenia    Osteopenia per Dexa Scan     Social History   Socioeconomic History  . Marital status: Married    Spouse name: Not on file  . Number of children: Not on file  . Years of education: Not on file  . Highest education level: Not on file  Occupational History  . Not on file  Tobacco Use  . Smoking status: Former Smoker    Types: Cigarettes    Quit date: 12/06/1970    Years since quitting: 48.8  . Smokeless tobacco: Never Used  Vaping Use  . Vaping Use: Never used  Substance and Sexual Activity  . Alcohol use: Yes    Alcohol/week: 5.0 standard drinks    Types: 5 Glasses of wine per week  . Drug use: No  . Sexual activity: Not on file  Other Topics Concern  . 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  Live in 4 different states.  Oldest son Corene Cornea lives locally. Second son Shanon Brow- lives in [place], daughter lives in [place], Shelby lives on [place]   No pets   Married for 43 years.     Social Determinants  of Health   Financial Resource Strain:   . Difficulty of Paying Living Expenses:   Food Insecurity:   . Worried About Charity fundraiser in the Last Year:   . Arboriculturist in the Last Year:   Transportation Needs:   . Film/video editor (Medical):   Marland Kitchen Lack of Transportation (Non-Medical):   Physical Activity:   . Days of Exercise per Week:   . Minutes of Exercise per Session:   Stress:   . Feeling of Stress :   Social Connections:   . Frequency of Communication with Friends and Family:   . Frequency of Social Gatherings with Friends and Family:   . Attends Religious Services:   . Active Member of Clubs or Organizations:   . Attends Archivist  Meetings:   Marland Kitchen Marital Status:   Intimate Partner Violence:   . Fear of Current or Ex-Partner:   . Emotionally Abused:   Marland Kitchen Physically Abused:   . Sexually Abused:     Past Surgical History:  Procedure Laterality Date  . CESAREAN SECTION    . KNEE SURGERY Left 2007   meniscus repair    Family History  Problem Relation Age of Onset  . Aneurysm Mother 53       Cerebral age 89  . CAD Brother 54  . Pancreatic cancer Maternal Grandmother   . Arthritis Maternal Grandmother   . Stroke Maternal Grandfather 1  . Arthritis Maternal Grandfather   . Breast cancer Paternal Grandmother 59  . Arthritis Paternal Grandmother   . Arthritis Paternal Grandfather   . CAD Brother 70    Allergies  Allergen Reactions  . Cabbage     "Migraine"  . Other Swelling    Peaches  . Ceclor [Cefaclor] Rash  . Latex Rash    Current Outpatient Medications on File Prior to Visit  Medication Sig Dispense Refill  . albuterol (PROAIR HFA) 108 (90 Base) MCG/ACT inhaler TAKE 2 PUFFS BY MOUTH EVERY 6 HOURS AS NEEDED FOR WHEEZE 8.5 Inhaler 2  . Calcium Carbonate-Vitamin D 600-400 MG-UNIT chew tablet Chew 1 tablet by mouth daily.    . fluticasone (FLONASE) 50 MCG/ACT nasal spray Place 1 spray into both nostrils daily as needed for allergies or rhinitis.    . Garlic 6301 MG CAPS Take 1 capsule by mouth daily.    Marland Kitchen loratadine (CLARITIN) 10 MG tablet Take 10 mg by mouth daily as needed for allergies.    . Multiple Vitamins-Minerals (MULTIVITAL PO) Take 1 tablet by mouth daily. MNS3. (metabolic nutrition system)    . Omega-3 Fatty Acids (FISH OIL) 1200 MG CAPS Take 1,200 mg by mouth 2 (two) times daily.    . SUMAtriptan (IMITREX) 100 MG tablet Take 1 tablet (100 mg total) by mouth every 2 (two) hours as needed for migraine. May repeat in 2 hours if headache persists or recurs. 10 tablet 1   No current facility-administered medications on file prior to visit.    BP 128/74 (BP Location: Right Arm, Patient  Position: Sitting, Cuff Size: Small)   Pulse 62   Temp (!) 97.4 F (36.3 C) (Temporal)   Resp 16   Ht 5' 2.5" (1.588 m)   Wt 150 lb (68 kg)   LMP 04/07/1997   SpO2 100%   BMI 27.00 kg/m       Objective:   Physical Exam Constitutional:      Appearance: She is well-developed.  HENT:     Head: Normocephalic  and atraumatic.     Right Ear: Tympanic membrane and ear canal normal.     Left Ear: Tympanic membrane and ear canal normal.  Neck:     Thyroid: No thyroid mass or thyromegaly.  Cardiovascular:     Rate and Rhythm: Normal rate and regular rhythm.     Heart sounds: Normal heart sounds. No murmur heard.   Pulmonary:     Effort: Pulmonary effort is normal. No respiratory distress.     Breath sounds: Normal breath sounds. No wheezing.  Abdominal:     General: Bowel sounds are normal.     Palpations: Abdomen is soft.     Tenderness: There is no abdominal tenderness.  Musculoskeletal:     Cervical back: Neck supple.  Lymphadenopathy:     Cervical: No cervical adenopathy.  Skin:    General: Skin is warm and dry.  Neurological:     Mental Status: She is alert and oriented to person, place, and time.     Deep Tendon Reflexes:     Reflex Scores:      Patellar reflexes are 2+ on the right side and 2+ on the left side. Psychiatric:        Behavior: Behavior normal.        Thought Content: Thought content normal.        Judgment: Judgment normal.   breast: normal breast exam bilaterally- no masses noted        Assessment & Plan:  Preventative care- encouraged pt to continue healthy diet and regular exercise. She will schedule mammogram late next month.  [place] up to date. She is due for shingrix and tetanus and I have sent these rx's to her pharmacy.   Atypical chest pain- EKG notes NSR (Sinus brady), ?LAFB.  Given family hx of CAD, will refer to cardiology for further evaluation. Pt is agreeable to plan.  This visit occurred during the SARS-CoV-2 public health  emergency.  Safety protocols were in place, including screening questions prior to the visit, additional usage of staff PPE, and extensive cleaning of exam room while observing appropriate contact time as indicated for disinfecting solutions.

## 2019-10-03 NOTE — Telephone Encounter (Signed)
Pt seen Anna Peterson this morning for an office visit.Patient that she will see provider listed below    Patient state that she would like to see this Doctor:  Dr. Minus Breeding Cardiovascular Disease. [place]

## 2019-10-04 DIAGNOSIS — R9431 Abnormal electrocardiogram [ECG] [EKG]: Secondary | ICD-10-CM | POA: Insufficient documentation

## 2019-10-04 DIAGNOSIS — Z7189 Other specified counseling: Secondary | ICD-10-CM | POA: Insufficient documentation

## 2019-10-04 NOTE — Progress Notes (Signed)
Cardiology Office Note   Date:  10/05/2019   ID:  Anna Peterson, DOB 1950/12/08, MRN [id number]  PCP:  [name], NP  Cardiologist:   Minus Breeding, MD  Referring:  [name], NP  Chief Complaint  Patient presents with  . Chest Pain      History of Present Illness: Anna Peterson is a 69 y.o. female who is referred by [name], NP for evaluation of an abnormal EKG.  She has an incomplete RBBB.  I saw her in 2018 for this.  She had an episode of chest discomfort.  This happened recently.  She is under stress because her husband was having a medical procedure.  She all that day had a 2 out of 10 mid chest discomfort.  There was no radiation to her jaw or to her arms.  There is no associated nausea vomiting or diaphoresis.  She had no excessive shortness of breath.  She does not have PND or orthopnea.  She does not have palpitations.  She does not exercise but she does walk a lot and do household chores including pushing a vacuum and going up and down stairs and has no limitations with this.  She had some mild EKG abnormalities with a leftward axis recently which is likely more lead placement.  She has had an RSR prime or incomplete right bundle branch block which is present on the current EKG.  She does have family history with her brothers having early onset coronary disease.  She has some dyslipidemia.  Past Medical History:  Diagnosis Date  . Allergy   . Asthma   . History of chicken pox   . Hyperlipidemia   . Migraine   . OSA (obstructive sleep apnea) 11/10/2017   Severe per home study 7/19  . OSA (obstructive sleep apnea) 11/10/2017  . Osteopenia    Osteopenia per Dexa Scan    Past Surgical History:  Procedure Laterality Date  . CESAREAN SECTION    . KNEE SURGERY Left 2007   meniscus repair     Current Outpatient Medications  Medication Sig Dispense Refill  . albuterol (PROAIR HFA) 108 (90 Base) MCG/ACT inhaler TAKE 2 PUFFS BY MOUTH EVERY 6  HOURS AS NEEDED FOR WHEEZE 8.5 Inhaler 2  . Calcium Carbonate-Vitamin D 600-400 MG-UNIT chew tablet Chew 1 tablet by mouth daily.    . fluticasone (FLONASE) 50 MCG/ACT nasal spray Place 1 spray into both nostrils daily as needed for allergies or rhinitis.    . Garlic 1062 MG CAPS Take 1 capsule by mouth daily.    Marland Kitchen loratadine (CLARITIN) 10 MG tablet Take 10 mg by mouth daily as needed for allergies.    . Multiple Vitamins-Minerals (MULTIVITAL PO) Take 1 tablet by mouth daily. MNS3. (metabolic nutrition system)    . Omega-3 Fatty Acids (FISH OIL) 1200 MG CAPS Take 1,200 mg by mouth 2 (two) times daily.    . SUMAtriptan (IMITREX) 100 MG tablet Take 1 tablet (100 mg total) by mouth every 2 (two) hours as needed for migraine. May repeat in 2 hours if headache persists or recurs. 10 tablet 1  . Zoster Vaccine Adjuvanted [facility]) injection Inject 0.5mg  IM now and again in 2-6 months. 0.5 mL 1   No current facility-administered medications for this visit.    Allergies:   Cabbage, Other, Ceclor [cefaclor], and Latex    ROS:  Please see the history of present illness.   Otherwise, review of systems are positive for none.  All other systems are reviewed and negative.    PHYSICAL EXAM: VS:  BP 125/74   Pulse 71   Temp (!) 97.1 F (36.2 C)   Ht 5\' 2"  (1.575 m)   Wt 150 lb 9.6 oz (68.3 kg)   LMP 04/07/1997   SpO2 99%   BMI 27.55 kg/m  , BMI Body mass index is 27.55 kg/m. GENERAL:  Well appearing NECK:  No jugular venous distention, waveform within normal limits, carotid upstroke brisk and symmetric, no bruits, no thyromegaly LUNGS:  Clear to auscultation bilaterally CHEST:  Unremarkable HEART:  PMI not displaced or sustained,S1 and S2 within normal limits, no S3, no S4, no clicks, no rubs, no murmurs ABD:  Flat, positive bowel sounds normal in frequency in pitch, no bruits, no rebound, no guarding, no midline pulsatile mass, no hepatomegaly, no splenomegaly EXT:  2 plus pulses throughout,  no edema, no cyanosis no clubbing   EKG:  EKG is not ordered today. The ekg ordered 10/03/2019 demonstrates sinus bradycardia, rate 56, leftward axis, early transition lead V2, incomplete right bundle branch block with RSR prime V1 V2, no acute ST-T wave changes.   Recent Labs: 11/26/2018: Magnesium 2.3; TSH 1.67    Lipid Panel    Component Value Date/Time   CHOL 226 (H) 10/01/2018 0908   TRIG 68.0 10/01/2018 0908   HDL 57.80 10/01/2018 0908   CHOLHDL 4 10/01/2018 0908   VLDL 13.6 10/01/2018 0908   LDLCALC 154 (H) 10/01/2018 0908      Wt Readings from Last 3 Encounters:  10/05/19 150 lb 9.6 oz (68.3 kg)  10/03/19 150 lb (68 kg)  10/01/18 152 lb 6.4 oz (69.1 kg)      Other studies Reviewed: Additional studies/ records that were reviewed today include: Primary care records and EKG. Review of the above records demonstrates:  NA   ASSESSMENT AND PLAN:   ABNORMAL EKG:     The patient again has some nonclinically significant EKG changes as above.  No further work-up.  CHEST PAIN: The patient has chest pain.  She does have significant risk factors.  I will screen her with a POET (Plain Old Exercise Treadmill).  She will also have a calcium score.   DYSLIPIDEMIA:   She does have an LDL of 154.  We talked about goals of therapy that will be determined by the above evaluation.  I would not be surprised if she does not have an indication for statin and we talked about this.  COVID EDUCATION:  She has had the vaccine.      Current medicines are reviewed at length with the patient today.  The patient does not have concerns regarding medicines.  The following changes have been made:  None  Labs/ tests ordered today include:   Orders Placed This Encounter  Procedures  . CT CARDIAC SCORING  . EXERCISE TOLERANCE TEST (ETT)     Disposition:   FU with me as needed. Ronnell Guadalajara, MD  10/05/2019 10:33 AM    [facility]

## 2019-10-04 NOTE — Telephone Encounter (Signed)
Noted  

## 2019-10-04 NOTE — Addendum Note (Signed)
Addended by: [name] on: 10/04/2019 07:26 AM   Modules accepted: Orders

## 2019-10-05 ENCOUNTER — Ambulatory Visit: Payer: Medicare Other | Admitting: Cardiology

## 2019-10-05 ENCOUNTER — Encounter: Payer: Self-pay | Admitting: Cardiology

## 2019-10-05 ENCOUNTER — Other Ambulatory Visit: Payer: Self-pay

## 2019-10-05 VITALS — BP 125/74 | HR 71 | Temp 97.1°F | Ht 62.0 in | Wt 150.6 lb

## 2019-10-05 DIAGNOSIS — Z7189 Other specified counseling: Secondary | ICD-10-CM | POA: Diagnosis not present

## 2019-10-05 DIAGNOSIS — R9431 Abnormal electrocardiogram [ECG] [EKG]: Secondary | ICD-10-CM | POA: Diagnosis not present

## 2019-10-05 DIAGNOSIS — E785 Hyperlipidemia, unspecified: Secondary | ICD-10-CM

## 2019-10-05 NOTE — Patient Instructions (Addendum)
Medication Instructions:  Your physician recommends that you continue on your current medications as directed. Please refer to the Current Medication list given to you today.  Testing/Procedures: Your physician has requested that you have an exercise tolerance test. For further information please visit [email or web address]. Please also follow instruction sheet, as given.   CT coronary calcium score. This test is done at [address]. This is $150 out of pocket.   Coronary CalciumScan A coronary calcium scan is an imaging test used to look for deposits of calcium and other fatty materials (plaques) in the inner lining of the blood vessels of the heart (coronary arteries). These deposits of calcium and plaques can partly clog and narrow the coronary arteries without producing any symptoms or warning signs. This puts a person at risk for a heart attack. This test can detect these deposits before symptoms develop. Tell a health care provider about:  Any allergies you have.  All medicines you are taking, including vitamins, herbs, eye drops, creams, and over-the-counter medicines.  Any problems you or family members have had with anesthetic medicines.  Any blood disorders you have.  Any surgeries you have had.  Any medical conditions you have.  Whether you are pregnant or may be pregnant. What are the risks? Generally, this is a safe procedure. However, problems may occur, including:  Harm to a pregnant woman and her unborn baby. This test involves the use of radiation. Radiation exposure can be dangerous to a pregnant woman and her unborn baby. If you are pregnant, you generally should not have this procedure done.  Slight increase in the risk of cancer. This is because of the radiation involved in the test. What happens before the procedure? No preparation is needed for this procedure. What happens during the procedure?  You will undress and remove any jewelry  around your neck or chest.  You will put on a hospital gown.  Sticky electrodes will be placed on your chest. The electrodes will be connected to an electrocardiogram (ECG) machine to record a tracing of the electrical activity of your heart.  A CT scanner will take pictures of your heart. During this time, you will be asked to lie still and hold your breath for 2-3 seconds while a picture of your heart is being taken. The procedure may vary among health care providers and hospitals. What happens after the procedure?  You can get dressed.  You can return to your normal activities.  It is up to you to get the results of your test. Ask your health care provider, or the department that is doing the test, when your results will be ready. Summary  A coronary calcium scan is an imaging test used to look for deposits of calcium and other fatty materials (plaques) in the inner lining of the blood vessels of the heart (coronary arteries).  Generally, this is a safe procedure. Tell your health care provider if you are pregnant or may be pregnant.  No preparation is needed for this procedure.  A CT scanner will take pictures of your heart.  You can return to your normal activities after the scan is done. This information is not intended to replace advice given to you by your health care provider. Make sure you discuss any questions you have with your health care provider. Document Released: 09/20/2007 Document Revised: 02/11/2016 Document Reviewed: 02/11/2016 Elsevier Interactive Patient Education  2017 [employer].   Follow-Up: At [facility], you and your health needs  are our priority.  As part of our continuing mission to provide you with exceptional heart care, we have created designated Provider Care Teams.  These Care Teams include your primary Cardiologist (physician) and Advanced Practice Providers (APPs -  Physician Assistants and Nurse Practitioners) who all work together to  provide you with the care you need, when you need it.  We recommend signing up for the patient portal called "MyChart".  Sign up information is provided on this After Visit Summary.  MyChart is used to connect with patients for Virtual Visits (Telemedicine).  Patients are able to view lab/test results, encounter notes, upcoming appointments, etc.  Non-urgent messages can be sent to your provider as well.   To learn more about what you can do with MyChart, go to [email or web address].    Your next appointment:   AS NEEDED with Dr. Percival Spanish

## 2019-10-06 ENCOUNTER — Telehealth (HOSPITAL_COMMUNITY): Payer: Self-pay | Admitting: Cardiology

## 2019-10-06 NOTE — Telephone Encounter (Signed)
error 

## 2019-10-06 NOTE — Telephone Encounter (Signed)
Per Estill Bamberg at [facility] office patient is scheduled for CT and GXT in 2 different offices and will not have time to get

## 2019-10-07 ENCOUNTER — Telehealth (HOSPITAL_COMMUNITY): Payer: Self-pay

## 2019-10-07 NOTE — Telephone Encounter (Signed)
Encounter complete. 

## 2019-10-11 ENCOUNTER — Ambulatory Visit (INDEPENDENT_AMBULATORY_CARE_PROVIDER_SITE_OTHER)
Admission: RE | Admit: 2019-10-11 | Discharge: 2019-10-11 | Disposition: A | Discharge status: Home / Self Care | Payer: Self-pay | Source: Ambulatory Visit | Attending: Cardiology | Admitting: Cardiology

## 2019-10-11 ENCOUNTER — Ambulatory Visit (HOSPITAL_COMMUNITY)
Admission: RE | Admit: 2019-10-11 | Discharge: 2019-10-11 | Disposition: A | Discharge status: Home / Self Care | Payer: Medicare Other | Source: Ambulatory Visit | Attending: Internal Medicine | Admitting: Internal Medicine

## 2019-10-11 ENCOUNTER — Other Ambulatory Visit: Payer: Self-pay

## 2019-10-11 DIAGNOSIS — R9431 Abnormal electrocardiogram [ECG] [EKG]: Secondary | ICD-10-CM

## 2019-10-11 DIAGNOSIS — I700 Atherosclerosis of aorta: Secondary | ICD-10-CM | POA: Diagnosis not present

## 2019-10-11 DIAGNOSIS — E785 Hyperlipidemia, unspecified: Secondary | ICD-10-CM

## 2019-10-11 DIAGNOSIS — J984 Other disorders of lung: Secondary | ICD-10-CM | POA: Diagnosis not present

## 2019-10-13 ENCOUNTER — Telehealth: Payer: Self-pay | Admitting: Cardiology

## 2019-10-13 LAB — EXERCISE TOLERANCE TEST
Estimated workload: 5.8 METS
Exercise duration (min): 4 min
Exercise duration (sec): 4 s
MPHR: 152 {beats}/min
Peak HR: 160 {beats}/min
Percent HR: 105 %
Rest HR: 61 {beats}/min

## 2019-10-13 NOTE — Telephone Encounter (Signed)
Pt was calling to see if the results from her stress test Tuesday 10/11/19 were ready yet. Please advise

## 2019-10-13 NOTE — Telephone Encounter (Signed)
Called patient, gave results of CT that was there as she had questions-  Patient notified that stress test had not been resulted by provider yet and would be called once they were.,  Patient verbalized understanding.

## 2019-10-24 DIAGNOSIS — L814 Other melanin hyperpigmentation: Secondary | ICD-10-CM | POA: Diagnosis not present

## 2019-10-24 DIAGNOSIS — L821 Other seborrheic keratosis: Secondary | ICD-10-CM | POA: Diagnosis not present

## 2019-10-24 DIAGNOSIS — D1801 Hemangioma of skin and subcutaneous tissue: Secondary | ICD-10-CM | POA: Diagnosis not present

## 2019-10-24 DIAGNOSIS — X32XXXS Exposure to sunlight, sequela: Secondary | ICD-10-CM | POA: Diagnosis not present

## 2019-10-24 DIAGNOSIS — Z85828 Personal history of other malignant neoplasm of skin: Secondary | ICD-10-CM | POA: Diagnosis not present

## 2019-11-01 DIAGNOSIS — M9903 Segmental and somatic dysfunction of lumbar region: Secondary | ICD-10-CM | POA: Diagnosis not present

## 2019-11-01 DIAGNOSIS — M9905 Segmental and somatic dysfunction of pelvic region: Secondary | ICD-10-CM | POA: Diagnosis not present

## 2019-11-01 DIAGNOSIS — M62830 Muscle spasm of back: Secondary | ICD-10-CM | POA: Diagnosis not present

## 2019-11-01 DIAGNOSIS — M9904 Segmental and somatic dysfunction of sacral region: Secondary | ICD-10-CM | POA: Diagnosis not present

## 2019-11-07 DIAGNOSIS — Z1231 Encounter for screening mammogram for malignant neoplasm of breast: Secondary | ICD-10-CM | POA: Diagnosis not present

## 2019-11-07 LAB — HM MAMMOGRAPHY

## 2019-12-06 DIAGNOSIS — G4733 Obstructive sleep apnea (adult) (pediatric): Secondary | ICD-10-CM | POA: Diagnosis not present

## 2019-12-13 DIAGNOSIS — M9903 Segmental and somatic dysfunction of lumbar region: Secondary | ICD-10-CM | POA: Diagnosis not present

## 2019-12-13 DIAGNOSIS — M62830 Muscle spasm of back: Secondary | ICD-10-CM | POA: Diagnosis not present

## 2019-12-13 DIAGNOSIS — M9904 Segmental and somatic dysfunction of sacral region: Secondary | ICD-10-CM | POA: Diagnosis not present

## 2019-12-13 DIAGNOSIS — M9905 Segmental and somatic dysfunction of pelvic region: Secondary | ICD-10-CM | POA: Diagnosis not present

## 2019-12-28 DIAGNOSIS — H35372 Puckering of macula, left eye: Secondary | ICD-10-CM | POA: Diagnosis not present

## 2019-12-28 DIAGNOSIS — H43813 Vitreous degeneration, bilateral: Secondary | ICD-10-CM | POA: Diagnosis not present

## 2019-12-28 DIAGNOSIS — H0100B Unspecified blepharitis left eye, upper and lower eyelids: Secondary | ICD-10-CM | POA: Diagnosis not present

## 2019-12-28 DIAGNOSIS — H0100A Unspecified blepharitis right eye, upper and lower eyelids: Secondary | ICD-10-CM | POA: Diagnosis not present

## 2019-12-28 DIAGNOSIS — H2513 Age-related nuclear cataract, bilateral: Secondary | ICD-10-CM | POA: Diagnosis not present

## 2020-01-08 DIAGNOSIS — R072 Precordial pain: Secondary | ICD-10-CM | POA: Insufficient documentation

## 2020-01-08 NOTE — Progress Notes (Signed)
Cardiology Office Note   Date:  01/09/2020   ID:  Anna Peterson, DOB 19-May-1950, MRN [id number]  PCP:  [name], NP  Cardiologist:   Minus Breeding, MD  Referring:  [name], NP   Chief Complaint  Patient presents with  . Chest Pain      History of Present Illness: Anna Peterson is a 69 y.o. female who was referred by [name], NP for evaluation of an abnormal EKG.  She has an incomplete RBBB.  I saw her in 2018 for this.   She had atypical chest pain more recently and earlier this year I sent her for a calcium score which was zero and a POET (Plain Old Exercise Treadmill) which was indeterminant.   Since I last saw her she has had no new cardiovascular complaints.  She walks at times with her husband for exercise.  He is being treated for prostate cancer. The patient denies any new symptoms such as chest discomfort, neck or arm discomfort. There has been no new shortness of breath, PND or orthopnea. There have been no reported palpitations, presyncope or syncope.   Past Medical History:  Diagnosis Date  . Allergy   . Asthma   . History of chicken pox   . Hyperlipidemia   . Migraine   . OSA (obstructive sleep apnea) 11/10/2017   Severe per home study 7/19  . OSA (obstructive sleep apnea) 11/10/2017  . Osteopenia    Osteopenia per Dexa Scan    Past Surgical History:  Procedure Laterality Date  . CESAREAN SECTION    . KNEE SURGERY Left 2007   meniscus repair     Current Outpatient Medications  Medication Sig Dispense Refill  . albuterol (PROAIR HFA) 108 (90 Base) MCG/ACT inhaler TAKE 2 PUFFS BY MOUTH EVERY 6 HOURS AS NEEDED FOR WHEEZE 8.5 Inhaler 2  . Calcium Carbonate-Vitamin D 600-400 MG-UNIT chew tablet Chew 1 tablet by mouth daily.    . fluticasone (FLONASE) 50 MCG/ACT nasal spray Place 1 spray into both nostrils daily as needed for allergies or rhinitis.    . Garlic 5366 MG CAPS Take 1 capsule by mouth daily.    Marland Kitchen loratadine  (CLARITIN) 10 MG tablet Take 10 mg by mouth daily as needed for allergies.    . Multiple Vitamins-Minerals (MULTIVITAL PO) Take 1 tablet by mouth daily. MNS3. (metabolic nutrition system)    . Omega-3 Fatty Acids (FISH OIL) 1200 MG CAPS Take 1,200 mg by mouth 2 (two) times daily.    . SUMAtriptan (IMITREX) 100 MG tablet Take 1 tablet (100 mg total) by mouth every 2 (two) hours as needed for migraine. May repeat in 2 hours if headache persists or recurs. 10 tablet 1  . Zoster Vaccine Adjuvanted [facility]) injection Inject 0.5mg  IM now and again in 2-6 months. 0.5 mL 1   No current facility-administered medications for this visit.    Allergies:   Cabbage, Other, Ceclor [cefaclor], and Latex    ROS:  Please see the history of present illness.   Otherwise, review of systems are positive for none.   All other systems are reviewed and negative.    PHYSICAL EXAM: VS:  BP (!) 147/84   Pulse 62   Ht 5\' 2"  (1.575 m)   Wt 153 lb 9.6 oz (69.7 kg)   LMP 04/07/1997   SpO2 100%   BMI 28.09 kg/m  , BMI Body mass index is 28.09 kg/m. GENERAL:  Well appearing NECK:  No jugular venous distention,  waveform within normal limits, carotid upstroke brisk and symmetric, no bruits, no thyromegaly LUNGS:  Clear to auscultation bilaterally CHEST:  Unremarkable HEART:  PMI not displaced or sustained,S1 and S2 within normal limits, no S3, no S4, no clicks, no rubs, no murmurs ABD:  Flat, positive bowel sounds normal in frequency in pitch, no bruits, no rebound, no guarding, no midline pulsatile mass, no hepatomegaly, no splenomegaly EXT:  2 plus pulses throughout, no edema, no cyanosis no clubbing   EKG:  EKG is not ordered today.    Recent Labs: No results found for requested labs within last 8760 hours.    Lipid Panel    Component Value Date/Time   CHOL 226 (H) 10/01/2018 0908   TRIG 68.0 10/01/2018 0908   HDL 57.80 10/01/2018 0908   CHOLHDL 4 10/01/2018 0908   VLDL 13.6 10/01/2018 0908    LDLCALC 154 (H) 10/01/2018 0908      Wt Readings from Last 3 Encounters:  01/09/20 153 lb 9.6 oz (69.7 kg)  10/05/19 150 lb 9.6 oz (68.3 kg)  10/03/19 150 lb (68 kg)      Other studies Reviewed: Additional studies/ records that were reviewed today include: None. Review of the above records demonstrates:  None   ASSESSMENT AND PLAN:   ABNORMAL EKG:    She has an incomplete right bundle branch block.  No further change in therapy is needed.  No further work-up.  CHEST PAIN:   Since her stress test she is having no further chest discomfort.  We talked to length about risk reduction.    DYSLIPIDEMIA:   Her LDL is now from 1 54-1 40.  HDLC is 63.  She has no indication for medical therapy.  We talked about diet and she is compliant.   COVID EDUCATION:  She has had the vaccine.   We talked about the booster.    Current medicines are reviewed at length with the patient today.  The patient does not have concerns regarding medicines.  The following changes have been made:  None  Labs/ tests ordered today include:   No orders of the defined types were placed in this encounter.    Disposition:   FU with me as needed. Ronnell Guadalajara, MD  01/09/2020 10:23 AM    [facility]

## 2020-01-09 ENCOUNTER — Encounter: Payer: Self-pay | Admitting: Cardiology

## 2020-01-09 ENCOUNTER — Ambulatory Visit (INDEPENDENT_AMBULATORY_CARE_PROVIDER_SITE_OTHER): Payer: Medicare Other | Admitting: Cardiology

## 2020-01-09 ENCOUNTER — Other Ambulatory Visit: Payer: Self-pay

## 2020-01-09 VITALS — BP 147/84 | HR 62 | Ht 62.0 in | Wt 153.6 lb

## 2020-01-09 DIAGNOSIS — E785 Hyperlipidemia, unspecified: Secondary | ICD-10-CM

## 2020-01-09 DIAGNOSIS — R9431 Abnormal electrocardiogram [ECG] [EKG]: Secondary | ICD-10-CM | POA: Diagnosis not present

## 2020-01-09 DIAGNOSIS — R072 Precordial pain: Secondary | ICD-10-CM | POA: Diagnosis not present

## 2020-01-09 NOTE — Patient Instructions (Signed)
Medication Instructions:  No changes  *If you need a refill on your cardiac medications before your next appointment, please call your pharmacy*   Lab Work: Not needed   Testing/Procedures: Not needed   Follow-Up: At [facility], you and your health needs are our priority.  As part of our continuing mission to provide you with exceptional heart care, we have created designated Provider Care Teams.  These Care Teams include your primary Cardiologist (physician) and Advanced Practice Providers (APPs -  Physician Assistants and Nurse Practitioners) who all work together to provide you with the care you need, when you need it.     Your next appointment:   2 year(s)  The format for your next appointment:   In Person  Provider:   Minus Breeding, MD

## 2020-01-10 DIAGNOSIS — M9904 Segmental and somatic dysfunction of sacral region: Secondary | ICD-10-CM | POA: Diagnosis not present

## 2020-01-10 DIAGNOSIS — M9903 Segmental and somatic dysfunction of lumbar region: Secondary | ICD-10-CM | POA: Diagnosis not present

## 2020-01-10 DIAGNOSIS — M9905 Segmental and somatic dysfunction of pelvic region: Secondary | ICD-10-CM | POA: Diagnosis not present

## 2020-01-10 DIAGNOSIS — M62830 Muscle spasm of back: Secondary | ICD-10-CM | POA: Diagnosis not present

## 2020-01-17 ENCOUNTER — Other Ambulatory Visit: Payer: Self-pay

## 2020-01-17 ENCOUNTER — Telehealth (INDEPENDENT_AMBULATORY_CARE_PROVIDER_SITE_OTHER): Payer: Medicare Other | Admitting: Family Medicine

## 2020-01-17 ENCOUNTER — Encounter: Payer: Self-pay | Admitting: Family Medicine

## 2020-01-17 VITALS — Temp 98.1°F

## 2020-01-17 DIAGNOSIS — J0100 Acute maxillary sinusitis, unspecified: Secondary | ICD-10-CM

## 2020-01-17 MED ORDER — PREDNISONE 20 MG PO TABS: 40.0000 mg | ORAL_TABLET | Freq: Every day | ORAL | 0 refills | Status: AC

## 2020-01-17 MED ORDER — AZITHROMYCIN 250 MG PO TABS: ORAL_TABLET | ORAL | 0 refills | Status: DC

## 2020-01-17 NOTE — Progress Notes (Signed)
Chief Complaint  Patient presents with  . Cough  . Nasal Congestion  . Ear Fullness    Anna Peterson here for URI complaints. Due to COVID-19 pandemic, we are interacting via web portal for an electronic face-to-face visit. I verified patient's ID using 2 identifiers. Patient agreed to proceed with visit via this method. Patient is at home, I am at office. Patient and I are present for visit.   Duration: 3 days  Associated symptoms: sinus congestion, sinus pain, b/l ear pain, rhinorrhea and cough Denies: itchy watery eyes, ear drainage, sore throat, wheezing, shortness of breath, myalgia and fevers Treatment to date: Tylenol, Flonase, Claritin, Sudafed Sick contacts: No  Past Medical History:  Diagnosis Date  . Allergy   . Asthma   . History of chicken pox   . Hyperlipidemia   . Migraine   . OSA (obstructive sleep apnea) 11/10/2017   Severe per home study 7/19  . OSA (obstructive sleep apnea) 11/10/2017  . Osteopenia    Osteopenia per Dexa Scan    Temp 98.1 F (36.7 C) (Oral)   LMP 04/07/1997  No conversational dyspnea Age appropriate judgment and insight Nml affect and mood  Acute maxillary sinusitis, recurrence not specified - Plan: predniSONE (DELTASONE) 20 MG tablet, azithromycin (ZITHROMAX) 250 MG tablet  Sounds allergy mediated. Will tx w pred burst. Zpak as contingency, pt reports she usually gets one and it does the trick. Question whether this was due to anti-inflammatory properties.  F/u prn.  Pt voiced understanding and agreement to the plan.  [place], DO 01/17/20 10:24 AM

## 2020-02-01 ENCOUNTER — Other Ambulatory Visit: Payer: Self-pay | Admitting: Family

## 2020-02-07 DIAGNOSIS — M9904 Segmental and somatic dysfunction of sacral region: Secondary | ICD-10-CM | POA: Diagnosis not present

## 2020-02-07 DIAGNOSIS — M9903 Segmental and somatic dysfunction of lumbar region: Secondary | ICD-10-CM | POA: Diagnosis not present

## 2020-02-07 DIAGNOSIS — M9905 Segmental and somatic dysfunction of pelvic region: Secondary | ICD-10-CM | POA: Diagnosis not present

## 2020-02-07 DIAGNOSIS — M62830 Muscle spasm of back: Secondary | ICD-10-CM | POA: Diagnosis not present

## 2020-02-17 DIAGNOSIS — H5315 Visual distortions of shape and size: Secondary | ICD-10-CM | POA: Diagnosis not present

## 2020-02-17 DIAGNOSIS — H43312 Vitreous membranes and strands, left eye: Secondary | ICD-10-CM | POA: Diagnosis not present

## 2020-02-17 DIAGNOSIS — H43813 Vitreous degeneration, bilateral: Secondary | ICD-10-CM | POA: Diagnosis not present

## 2020-02-17 DIAGNOSIS — H35372 Puckering of macula, left eye: Secondary | ICD-10-CM | POA: Diagnosis not present

## 2020-03-12 DIAGNOSIS — G4733 Obstructive sleep apnea (adult) (pediatric): Secondary | ICD-10-CM | POA: Diagnosis not present

## 2020-03-13 DIAGNOSIS — M9904 Segmental and somatic dysfunction of sacral region: Secondary | ICD-10-CM | POA: Diagnosis not present

## 2020-03-13 DIAGNOSIS — M62830 Muscle spasm of back: Secondary | ICD-10-CM | POA: Diagnosis not present

## 2020-03-13 DIAGNOSIS — M9903 Segmental and somatic dysfunction of lumbar region: Secondary | ICD-10-CM | POA: Diagnosis not present

## 2020-03-13 DIAGNOSIS — M9905 Segmental and somatic dysfunction of pelvic region: Secondary | ICD-10-CM | POA: Diagnosis not present

## 2020-03-22 DIAGNOSIS — E782 Mixed hyperlipidemia: Secondary | ICD-10-CM | POA: Diagnosis not present

## 2020-03-22 DIAGNOSIS — E559 Vitamin D deficiency, unspecified: Secondary | ICD-10-CM | POA: Diagnosis not present

## 2020-04-11 DIAGNOSIS — H43312 Vitreous membranes and strands, left eye: Secondary | ICD-10-CM | POA: Diagnosis not present

## 2020-04-11 DIAGNOSIS — H5315 Visual distortions of shape and size: Secondary | ICD-10-CM | POA: Diagnosis not present

## 2020-04-11 DIAGNOSIS — H43813 Vitreous degeneration, bilateral: Secondary | ICD-10-CM | POA: Diagnosis not present

## 2020-04-11 DIAGNOSIS — H35372 Puckering of macula, left eye: Secondary | ICD-10-CM | POA: Diagnosis not present

## 2020-04-26 DIAGNOSIS — M62830 Muscle spasm of back: Secondary | ICD-10-CM | POA: Diagnosis not present

## 2020-04-26 DIAGNOSIS — M9904 Segmental and somatic dysfunction of sacral region: Secondary | ICD-10-CM | POA: Diagnosis not present

## 2020-04-26 DIAGNOSIS — M9905 Segmental and somatic dysfunction of pelvic region: Secondary | ICD-10-CM | POA: Diagnosis not present

## 2020-04-26 DIAGNOSIS — M9903 Segmental and somatic dysfunction of lumbar region: Secondary | ICD-10-CM | POA: Diagnosis not present

## 2020-05-22 DIAGNOSIS — M9904 Segmental and somatic dysfunction of sacral region: Secondary | ICD-10-CM | POA: Diagnosis not present

## 2020-05-22 DIAGNOSIS — M62830 Muscle spasm of back: Secondary | ICD-10-CM | POA: Diagnosis not present

## 2020-05-22 DIAGNOSIS — M9905 Segmental and somatic dysfunction of pelvic region: Secondary | ICD-10-CM | POA: Diagnosis not present

## 2020-05-22 DIAGNOSIS — M9903 Segmental and somatic dysfunction of lumbar region: Secondary | ICD-10-CM | POA: Diagnosis not present

## 2020-06-19 DIAGNOSIS — M9904 Segmental and somatic dysfunction of sacral region: Secondary | ICD-10-CM | POA: Diagnosis not present

## 2020-06-19 DIAGNOSIS — M9905 Segmental and somatic dysfunction of pelvic region: Secondary | ICD-10-CM | POA: Diagnosis not present

## 2020-06-19 DIAGNOSIS — M9903 Segmental and somatic dysfunction of lumbar region: Secondary | ICD-10-CM | POA: Diagnosis not present

## 2020-06-19 DIAGNOSIS — M62830 Muscle spasm of back: Secondary | ICD-10-CM | POA: Diagnosis not present

## 2020-06-29 DIAGNOSIS — H5213 Myopia, bilateral: Secondary | ICD-10-CM | POA: Diagnosis not present

## 2020-06-29 DIAGNOSIS — H43813 Vitreous degeneration, bilateral: Secondary | ICD-10-CM | POA: Diagnosis not present

## 2020-06-29 DIAGNOSIS — H2513 Age-related nuclear cataract, bilateral: Secondary | ICD-10-CM | POA: Diagnosis not present

## 2020-06-29 DIAGNOSIS — H35372 Puckering of macula, left eye: Secondary | ICD-10-CM | POA: Diagnosis not present

## 2020-06-29 DIAGNOSIS — H524 Presbyopia: Secondary | ICD-10-CM | POA: Diagnosis not present

## 2020-06-29 DIAGNOSIS — H43312 Vitreous membranes and strands, left eye: Secondary | ICD-10-CM | POA: Diagnosis not present

## 2020-06-29 DIAGNOSIS — H52223 Regular astigmatism, bilateral: Secondary | ICD-10-CM | POA: Diagnosis not present

## 2020-07-09 DIAGNOSIS — G4733 Obstructive sleep apnea (adult) (pediatric): Secondary | ICD-10-CM | POA: Diagnosis not present

## 2020-07-11 DIAGNOSIS — H43813 Vitreous degeneration, bilateral: Secondary | ICD-10-CM | POA: Diagnosis not present

## 2020-07-11 DIAGNOSIS — H5315 Visual distortions of shape and size: Secondary | ICD-10-CM | POA: Diagnosis not present

## 2020-07-11 DIAGNOSIS — H35373 Puckering of macula, bilateral: Secondary | ICD-10-CM | POA: Diagnosis not present

## 2020-07-11 DIAGNOSIS — H2513 Age-related nuclear cataract, bilateral: Secondary | ICD-10-CM | POA: Diagnosis not present

## 2020-07-13 ENCOUNTER — Encounter: Payer: Self-pay | Admitting: Family

## 2020-07-13 ENCOUNTER — Other Ambulatory Visit: Payer: Self-pay

## 2020-07-13 ENCOUNTER — Ambulatory Visit (INDEPENDENT_AMBULATORY_CARE_PROVIDER_SITE_OTHER): Payer: Medicare Other | Admitting: Family

## 2020-07-13 VITALS — BP 144/82 | HR 66 | Temp 98.3°F | Resp 16 | Ht 62.0 in | Wt 155.0 lb

## 2020-07-13 DIAGNOSIS — R3915 Urgency of urination: Secondary | ICD-10-CM | POA: Diagnosis not present

## 2020-07-13 DIAGNOSIS — R350 Frequency of micturition: Secondary | ICD-10-CM

## 2020-07-13 LAB — POC URINALSYSI DIPSTICK (AUTOMATED)
Bilirubin, UA: NEGATIVE
Blood, UA: NEGATIVE
Glucose, UA: NEGATIVE
Ketones, UA: NEGATIVE
Leukocytes, UA: NEGATIVE
Nitrite, UA: NEGATIVE
Protein, UA: NEGATIVE
Spec Grav, UA: 1.01 (ref 1.010–1.025)
Urobilinogen, UA: 0.2 E.U./dL
pH, UA: 7 (ref 5.0–8.0)

## 2020-07-13 NOTE — Patient Instructions (Signed)
We will let you know how your urine culture turns out.

## 2020-07-13 NOTE — Progress Notes (Signed)
Subjective:   By signing my name below, I, Lucille Passy, attest that this documentation has been prepared under the direction and in the presence of [name]. 07/13/20     Patient ID: Anna Peterson, female    DOB: 03/18/1951, 70 y.o.   MRN: [id number]  Chief Complaint  Patient presents with  . Urinary Frequency    Complains of urinary frequency with urgency    HPI Patient is in today for a office visit complaining of urinary issues for a few days. She describes feeling urgency to urinate. She has vaginal odor but this is her baseline. She is more concerned about her urinary urgency. She has no hematuria, back pain, or urinary burning. She drinks only hot tea, water, and wine. 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 She has a loss in her central vision of left eye due to this shredding. She does not drive at night due to her loss in vision. She is UTD on her vaccinations. Otherwise she is doing well.   Past Medical History:  Diagnosis Date  . Allergy   . Asthma   . History of chicken pox   . Hyperlipidemia   . Migraine   . OSA (obstructive sleep apnea) 11/10/2017   Severe per home study 7/19  . OSA (obstructive sleep apnea) 11/10/2017  . Osteopenia    Osteopenia per Dexa Scan    Past Surgical History:  Procedure Laterality Date  . CESAREAN SECTION    . KNEE SURGERY Left 2007   meniscus repair    Family History  Problem Relation Age of Onset  . Aneurysm Mother 56       Cerebral age 69  . CAD Brother 42  . Pancreatic cancer Maternal Grandmother   . Arthritis Maternal Grandmother   . Stroke Maternal Grandfather 11  . Arthritis Maternal Grandfather   . Breast cancer Paternal Grandmother 64  . Arthritis Paternal Grandmother   . Arthritis Paternal Grandfather   . CAD Brother 72    Social History   Socioeconomic History  . Marital status: Married    Spouse name: Not on file  . Number of children: Not on file  . Years of  education: Not on file  . Highest education level: Not on file  Occupational History  . Not on file  Tobacco Use  . Smoking status: Former Smoker    Types: Cigarettes    Quit date: 12/06/1970    Years since quitting: 49.6  . Smokeless tobacco: Never Used  Vaping Use  . Vaping Use: Never used  Substance and Sexual Activity  . Alcohol use: Yes    Alcohol/week: 5.0 standard drinks    Types: 5 Glasses of wine per week  . Drug use: No  . Sexual activity: Not on file  Other Topics Concern  . 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  Live in 4 different states.  Oldest son Corene Cornea lives locally. Second son Shanon Brow- lives in [place], daughter lives in [place], [place] lives on [place]   No pets   Married for 43 years.     Social Determinants of Health   Financial Resource Strain: Not on file  Food Insecurity: Not  on file  Transportation Needs: Not on file  Physical Activity: Not on file  Stress: Not on file  Social Connections: Not on file  Intimate Partner Violence: Not on file    Outpatient Medications Prior to Visit  Medication Sig Dispense Refill  . albuterol (VENTOLIN HFA) 108 (90 Base) MCG/ACT inhaler INHALE 2 PUFFS BY MOUTH EVERY 6 HOURS AS NEEDED FOR WHEEZE 8.5 each 2  . Calcium Carbonate-Vitamin D 600-400 MG-UNIT chew tablet Chew 1 tablet by mouth daily.    Marland Kitchen CITRUS BERGAMOT PO Take by mouth.    . fluticasone (FLONASE) 50 MCG/ACT nasal spray Place 1 spray into both nostrils daily as needed for allergies or rhinitis.    . Garlic 8889 MG CAPS Take 1 capsule by mouth daily.    Marland Kitchen loratadine (CLARITIN) 10 MG tablet Take 10 mg by mouth daily as needed for allergies.    . Multiple Vitamins-Minerals (MULTIVITAL PO) Take 1 tablet by mouth daily. MNS3. (metabolic  nutrition system)    . Omega-3 Fatty Acids (FISH OIL) 1200 MG CAPS Take 1,200 mg by mouth 2 (two) times daily.    . SUMAtriptan (IMITREX) 100 MG tablet Take 1 tablet (100 mg total) by mouth every 2 (two) hours as needed for migraine. May repeat in 2 hours if headache persists or recurs. 10 tablet 1  . Zoster Vaccine Adjuvanted [facility]) injection Inject 0.5mg  IM now and again in 2-6 months. 0.5 mL 1  . azithromycin (ZITHROMAX) 250 MG tablet Take 2 tabs the first day and then 1 tab daily until you run out. 6 tablet 0   No facility-administered medications prior to visit.    Allergies  Allergen Reactions  . Cabbage     "Migraine"  . Other Swelling    Peaches  . Ceclor [Cefaclor] Rash  . Latex Rash    Review of Systems  Genitourinary: Positive for urgency. Negative for dysuria and hematuria.       Objective:    Physical Exam Constitutional:      General: She is not in acute distress.    Appearance: Normal appearance. She is not ill-appearing.  HENT:     Head: Normocephalic and atraumatic.     Right Ear: External ear normal.     Left Ear: External ear normal.  Eyes:     Extraocular Movements: Extraocular movements intact.     Pupils: Pupils are equal, round, and reactive to light.  Cardiovascular:     Rate and Rhythm: Normal rate and regular rhythm.     Pulses: Normal pulses.     Heart sounds: Normal heart sounds. No murmur heard.   Pulmonary:     Effort: Pulmonary effort is normal. No respiratory distress.     Breath sounds: Normal breath sounds. No wheezing or rales.  Abdominal:     Palpations: Abdomen is soft.     Tenderness: There is no abdominal tenderness. There is no right CVA tenderness or left CVA tenderness.  Neurological:     Mental Status: She is alert.     BP (!) 144/82 (BP Location: Right Arm, Patient Position: Sitting, Cuff Size: Small)   Pulse 66   Temp 98.3 F (36.8 C) (Oral)   Resp 16   Ht 5\' 2"  (1.575 m)   Wt 155 lb (70.3 kg)   LMP  04/07/1997   SpO2 100%   BMI 28.35 kg/m  Wt Readings from Last 3 Encounters:  07/13/20 155 lb (70.3 kg)  01/09/20 153 lb 9.6 oz (69.7 kg)  10/05/19 150 lb 9.6 oz (68.3 kg)    Diabetic Foot Exam - Simple   No data filed    Lab Results  Component Value Date   GLUCOSE 95 10/01/2018   CHOL 226 (H) 10/01/2018   TRIG 68.0 10/01/2018   HDL 57.80 10/01/2018   LDLCALC 154 (H) 10/01/2018   ALT 21 10/01/2018   AST 21 10/01/2018   NA 140 10/01/2018   K 4.4 10/01/2018   CL 104 10/01/2018   CREATININE 0.72 10/01/2018   BUN 12 10/01/2018   CO2 27 10/01/2018   TSH 1.67 11/26/2018    Lab Results  Component Value Date   TSH 1.67 11/26/2018   No results found for: WBC, HGB, HCT, MCV, PLT Lab Results  Component Value Date   NA 140 10/01/2018   K 4.4 10/01/2018   CO2 27 10/01/2018   GLUCOSE 95 10/01/2018   BUN 12 10/01/2018   CREATININE 0.72 10/01/2018   BILITOT 0.6 10/01/2018   ALKPHOS 60 10/01/2018   AST 21 10/01/2018   ALT 21 10/01/2018   PROT 7.1 10/01/2018   ALBUMIN 4.6 10/01/2018   CALCIUM 10.0 11/26/2018   GFR 80.75 10/01/2018   Lab Results  Component Value Date   CHOL 226 (H) 10/01/2018   Lab Results  Component Value Date   HDL 57.80 10/01/2018   Lab Results  Component Value Date   LDLCALC 154 (H) 10/01/2018   Lab Results  Component Value Date   TRIG 68.0 10/01/2018   Lab Results  Component Value Date   CHOLHDL 4 10/01/2018   No results found for: HGBA1C     Assessment & Plan:   Problem List Items Addressed This Visit      Other   Urinary urgency    UA is clear.  Will send for culture to confirm no UTI.  We discussed that symptoms may be due to OAB.  Discussed possibility of OAB medication trial. She declines as she does not wish to deal with the potential side effects.  Monitor.       Other Visit Diagnoses    Urinary frequency    -  Primary   Relevant Orders   POCT Urinalysis Dipstick (Automated) (Completed)   Urine Culture      No  orders of the defined types were placed in this encounter.   I, Lucille Passy, personally preformed the services described in this documentation.  All medical record entries made by the scribe were at my direction and in my presence.  I have reviewed the chart and discharge instructions (if applicable) and agree that the record reflects my personal performance and is accurate and complete. 07/13/2020  I,Alexis Bryant,acting as a scribe for Nance Pear, NP.,have documented all relevant documentation on the behalf of Nance Pear, NP,as directed by  Nance Pear, NP while in the presence of Nance Pear, NP.  Lucille Passy

## 2020-07-13 NOTE — Assessment & Plan Note (Signed)
UA is clear.  Will send for culture to confirm no UTI.  We discussed that symptoms may be due to OAB.  Discussed possibility of OAB medication trial. She declines as she does not wish to deal with the potential side effects.  Monitor.

## 2020-07-14 LAB — URINE CULTURE
MICRO NUMBER:: 11748308
SPECIMEN QUALITY:: ADEQUATE

## 2020-07-17 DIAGNOSIS — M9905 Segmental and somatic dysfunction of pelvic region: Secondary | ICD-10-CM | POA: Diagnosis not present

## 2020-07-17 DIAGNOSIS — M9904 Segmental and somatic dysfunction of sacral region: Secondary | ICD-10-CM | POA: Diagnosis not present

## 2020-07-17 DIAGNOSIS — M62830 Muscle spasm of back: Secondary | ICD-10-CM | POA: Diagnosis not present

## 2020-07-17 DIAGNOSIS — M9903 Segmental and somatic dysfunction of lumbar region: Secondary | ICD-10-CM | POA: Diagnosis not present

## 2020-08-13 DIAGNOSIS — M9904 Segmental and somatic dysfunction of sacral region: Secondary | ICD-10-CM | POA: Diagnosis not present

## 2020-08-13 DIAGNOSIS — M9905 Segmental and somatic dysfunction of pelvic region: Secondary | ICD-10-CM | POA: Diagnosis not present

## 2020-08-13 DIAGNOSIS — M9903 Segmental and somatic dysfunction of lumbar region: Secondary | ICD-10-CM | POA: Diagnosis not present

## 2020-08-13 DIAGNOSIS — M62830 Muscle spasm of back: Secondary | ICD-10-CM | POA: Diagnosis not present

## 2020-08-14 DIAGNOSIS — M9904 Segmental and somatic dysfunction of sacral region: Secondary | ICD-10-CM | POA: Diagnosis not present

## 2020-08-14 DIAGNOSIS — M9905 Segmental and somatic dysfunction of pelvic region: Secondary | ICD-10-CM | POA: Diagnosis not present

## 2020-08-14 DIAGNOSIS — M62830 Muscle spasm of back: Secondary | ICD-10-CM | POA: Diagnosis not present

## 2020-08-14 DIAGNOSIS — M9903 Segmental and somatic dysfunction of lumbar region: Secondary | ICD-10-CM | POA: Diagnosis not present

## 2020-08-24 ENCOUNTER — Encounter: Payer: Self-pay | Admitting: Family

## 2020-08-24 ENCOUNTER — Other Ambulatory Visit: Payer: Self-pay

## 2020-08-24 ENCOUNTER — Ambulatory Visit (INDEPENDENT_AMBULATORY_CARE_PROVIDER_SITE_OTHER): Payer: Medicare Other | Admitting: Family

## 2020-08-24 DIAGNOSIS — H6123 Impacted cerumen, bilateral: Secondary | ICD-10-CM | POA: Diagnosis not present

## 2020-08-24 DIAGNOSIS — J329 Chronic sinusitis, unspecified: Secondary | ICD-10-CM | POA: Insufficient documentation

## 2020-08-24 DIAGNOSIS — J029 Acute pharyngitis, unspecified: Secondary | ICD-10-CM | POA: Diagnosis not present

## 2020-08-24 DIAGNOSIS — J019 Acute sinusitis, unspecified: Secondary | ICD-10-CM

## 2020-08-24 LAB — POCT RAPID STREP A (OFFICE): Rapid Strep A Screen: POSITIVE — AB

## 2020-08-24 MED ORDER — FLUCONAZOLE 150 MG PO TABS: 150.0000 mg | ORAL_TABLET | Freq: Once | ORAL | 0 refills | Status: AC

## 2020-08-24 MED ORDER — AMOXICILLIN-POT CLAVULANATE 875-125 MG PO TABS: 1.0000 | ORAL_TABLET | Freq: Two times a day (BID) | ORAL | 0 refills | Status: DC

## 2020-08-24 NOTE — Assessment & Plan Note (Signed)
Ceruminosis is noted.  After verbal consent was obtained, wax is removed by syringing (by CMA) and manual debridement with a curette by NP.

## 2020-08-24 NOTE — Assessment & Plan Note (Signed)
Will treat with augmentin 875mg  bid x 10 days.  I gave her an rx for diflucan to have on hand in case of vaginal yeast infection. Strep swab was obtained and I believe I saw a very faint line. Augmentin will also cover for strep.

## 2020-08-24 NOTE — Progress Notes (Signed)
Subjective:   By signing my name below, I, [name], attest that this documentation has been prepared under the direction and in the presence of [name]. 08/24/2020      Patient ID: Anna Peterson, female    DOB: [birth year], 70 y.o.   MRN: [id number]  No chief complaint on file.   HPI Patient is in today for a office visit.   She complains of congestion and cold symptoms. She also reports ear discomfort from pressure. She tested negative for Covid-19 using a at home Covid test. She recently traveled to [place] with her family. She denies having any fever, chills, fatigue at this time.   Past Medical History:  Diagnosis Date  . Allergy   . Asthma   . History of chicken pox   . Hyperlipidemia   . Migraine   . OSA (obstructive sleep apnea) 11/10/2017   Severe per home study 7/19  . OSA (obstructive sleep apnea) 11/10/2017  . Osteopenia    Osteopenia per Dexa Scan    Past Surgical History:  Procedure Laterality Date  . CESAREAN SECTION    . KNEE SURGERY Left 2007   meniscus repair    Family History  Problem Relation Age of Onset  . Aneurysm Mother 52       Cerebral age 45  . CAD Brother 32  . Pancreatic cancer Maternal Grandmother   . Arthritis Maternal Grandmother   . Stroke Maternal Grandfather 31  . Arthritis Maternal Grandfather   . Breast cancer Paternal Grandmother 27  . Arthritis Paternal Grandmother   . Arthritis Paternal Grandfather   . CAD Brother 36    Social History   Socioeconomic History  . Marital status: Married    Spouse name: Not on file  . Number of children: Not on file  . Years of education: Not on file  . Highest education level: Not on file  Occupational History  . Not on file  Tobacco Use  . Smoking status: Former Smoker    Types: Cigarettes    Quit date: 12/06/1970    Years since quitting: 49.7  . Smokeless tobacco: Never Used  Vaping Use  . Vaping Use: Never used  Substance and Sexual Activity  . Alcohol  use: Yes    Alcohol/week: 5.0 standard drinks    Types: 5 Glasses of wine per week  . Drug use: No  . Sexual activity: Not on file  Other Topics Concern  . 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  Live in 4 different states.  Oldest son Corene Cornea lives locally. Second son Shanon Brow- lives in [place], daughter lives in [place], [place] lives on [address]   No pets   Married for 43 years.     Social Determinants of Health   Financial Resource Strain: Not on file  Food Insecurity: Not on file  Transportation Needs: Not on file  Physical Activity: Not on file  Stress: Not on file  Social Connections: Not on file  Intimate Partner Violence: Not on file    Outpatient Medications Prior to Visit  Medication Sig Dispense Refill  . albuterol (VENTOLIN HFA) 108 (90 Base) MCG/ACT inhaler INHALE 2 PUFFS BY MOUTH EVERY 6 HOURS AS NEEDED FOR WHEEZE  8.5 each 2  . Calcium Carbonate-Vitamin D 600-400 MG-UNIT chew tablet Chew 1 tablet by mouth daily.    Marland Kitchen CITRUS BERGAMOT PO Take by mouth.    . fluticasone (FLONASE) 50 MCG/ACT nasal spray Place 1 spray into both nostrils daily as needed for allergies or rhinitis.    . Garlic 3536 MG CAPS Take 1 capsule by mouth daily.    Marland Kitchen loratadine (CLARITIN) 10 MG tablet Take 10 mg by mouth daily as needed for allergies.    . Multiple Vitamins-Minerals (MULTIVITAL PO) Take 1 tablet by mouth daily. MNS3. (metabolic nutrition system)    . Omega-3 Fatty Acids (FISH OIL) 1200 MG CAPS Take 1,200 mg by mouth 2 (two) times daily.    . SUMAtriptan (IMITREX) 100 MG tablet Take 1 tablet (100 mg total) by mouth every 2 (two) hours as needed for migraine. May repeat in 2 hours if headache persists or recurs. 10 tablet 1  . Zoster Vaccine Adjuvanted [facility]) injection  Inject 0.5mg  IM now and again in 2-6 months. 0.5 mL 1   No facility-administered medications prior to visit.    Allergies  Allergen Reactions  . Cabbage     "Migraine"  . Other Swelling    Peaches  . Ceclor [Cefaclor] Rash  . Latex Rash    Review of Systems  Constitutional: Negative for chills, fever and malaise/fatigue.  HENT: Positive for congestion, ear pain and sore throat.        Objective:    Physical Exam Constitutional:      Appearance: Normal appearance.  HENT:     Head: Normocephalic and atraumatic.     Right Ear: External ear normal. There is impacted cerumen.     Left Ear: External ear normal. There is impacted cerumen.     Ears:     Comments: Earwax in both ears but more in the left than right. Left ear TM occluded from cerumen  Bilateral TM's found normal after wax removal.     Nose:     Right Sinus: Maxillary sinus tenderness present. No frontal sinus tenderness.     Left Sinus: Maxillary sinus tenderness present. No frontal sinus tenderness.     Mouth/Throat:     Pharynx: Posterior oropharyngeal erythema (Mild) present. No oropharyngeal exudate.     Comments: Tonsils were normal size. Eyes:     Extraocular Movements: Extraocular movements intact.     Pupils: Pupils are equal, round, and reactive to light.  Cardiovascular:     Rate and Rhythm: Normal rate and regular rhythm.     Pulses: Normal pulses.     Heart sounds: Normal heart sounds. No murmur heard. No gallop.   Pulmonary:     Effort: Pulmonary effort is normal. No respiratory distress.     Breath sounds: Normal breath sounds. No wheezing, rhonchi or rales.  Skin:    General: Skin is warm and dry.  Neurological:     Mental Status: She is alert and oriented to person, place, and time.  Psychiatric:        Behavior: Behavior normal.     LMP 04/07/1997  Wt Readings from Last 3 Encounters:  07/13/20 155 lb (70.3 kg)  01/09/20 153 lb 9.6 oz (69.7 kg)  10/05/19 150 lb 9.6 oz (68.3 kg)     Diabetic Foot Exam - Simple   No data filed    Lab Results  Component Value Date   GLUCOSE 95 10/01/2018   CHOL 226 (H) 10/01/2018   TRIG 68.0 10/01/2018  HDL 57.80 10/01/2018   LDLCALC 154 (H) 10/01/2018   ALT 21 10/01/2018   AST 21 10/01/2018   NA 140 10/01/2018   K 4.4 10/01/2018   CL 104 10/01/2018   CREATININE 0.72 10/01/2018   BUN 12 10/01/2018   CO2 27 10/01/2018   TSH 1.67 11/26/2018    Lab Results  Component Value Date   TSH 1.67 11/26/2018   No results found for: WBC, HGB, HCT, MCV, PLT Lab Results  Component Value Date   NA 140 10/01/2018   K 4.4 10/01/2018   CO2 27 10/01/2018   GLUCOSE 95 10/01/2018   BUN 12 10/01/2018   CREATININE 0.72 10/01/2018   BILITOT 0.6 10/01/2018   ALKPHOS 60 10/01/2018   AST 21 10/01/2018   ALT 21 10/01/2018   PROT 7.1 10/01/2018   ALBUMIN 4.6 10/01/2018   CALCIUM 10.0 11/26/2018   GFR 80.75 10/01/2018   Lab Results  Component Value Date   CHOL 226 (H) 10/01/2018   Lab Results  Component Value Date   HDL 57.80 10/01/2018   Lab Results  Component Value Date   LDLCALC 154 (H) 10/01/2018   Lab Results  Component Value Date   TRIG 68.0 10/01/2018   Lab Results  Component Value Date   CHOLHDL 4 10/01/2018   No results found for: HGBA1C     Assessment & Plan:   Problem List Items Addressed This Visit   None      No orders of the defined types were placed in this encounter.   I, [name], personally preformed the services described in this documentation.  All medical record entries made by the scribe were at my direction and in my presence.  I have reviewed the chart and discharge instructions (if applicable) and agree that the record reflects my personal performance and is accurate and complete. 08/24/2020   I,[name],acting as a Education administrator for Nance Pear, NP.,have documented all relevant documentation on the behalf of Nance Pear, NP,as directed by  Nance Pear, NP while in the presence of Nance Pear, NP.   Shehryar [employer]

## 2020-08-24 NOTE — Patient Instructions (Signed)
Please start augmentin twice daily. Call if symptoms worsen or if symptoms are not improved in 3-4 days.

## 2020-08-29 DIAGNOSIS — Z20822 Contact with and (suspected) exposure to covid-19: Secondary | ICD-10-CM | POA: Diagnosis not present

## 2020-08-30 ENCOUNTER — Encounter: Payer: Self-pay | Admitting: Family

## 2020-09-18 DIAGNOSIS — M62830 Muscle spasm of back: Secondary | ICD-10-CM | POA: Diagnosis not present

## 2020-09-18 DIAGNOSIS — M9904 Segmental and somatic dysfunction of sacral region: Secondary | ICD-10-CM | POA: Diagnosis not present

## 2020-09-18 DIAGNOSIS — M9903 Segmental and somatic dysfunction of lumbar region: Secondary | ICD-10-CM | POA: Diagnosis not present

## 2020-09-18 DIAGNOSIS — M9905 Segmental and somatic dysfunction of pelvic region: Secondary | ICD-10-CM | POA: Diagnosis not present

## 2020-09-19 ENCOUNTER — Ambulatory Visit: Payer: Medicare Other | Admitting: Family Medicine

## 2020-09-19 ENCOUNTER — Ambulatory Visit: Payer: Medicare Other | Admitting: Family

## 2020-10-12 ENCOUNTER — Other Ambulatory Visit: Payer: Self-pay

## 2020-10-12 ENCOUNTER — Ambulatory Visit (INDEPENDENT_AMBULATORY_CARE_PROVIDER_SITE_OTHER): Payer: Medicare Other | Admitting: Family

## 2020-10-12 ENCOUNTER — Encounter: Payer: Self-pay | Admitting: Family

## 2020-10-12 VITALS — BP 127/83 | HR 66 | Temp 98.4°F | Resp 16 | Ht 62.0 in | Wt 151.6 lb

## 2020-10-12 DIAGNOSIS — E785 Hyperlipidemia, unspecified: Secondary | ICD-10-CM

## 2020-10-12 DIAGNOSIS — Z0000 Encounter for general adult medical examination without abnormal findings: Secondary | ICD-10-CM

## 2020-10-12 LAB — COMPREHENSIVE METABOLIC PANEL
ALT: 20 U/L (ref 0–35)
AST: 22 U/L (ref 0–37)
Albumin: 4.8 g/dL (ref 3.5–5.2)
Alkaline Phosphatase: 61 U/L (ref 39–117)
BUN: 12 mg/dL (ref 6–23)
CO2: 26 mEq/L (ref 19–32)
Calcium: 10.1 mg/dL (ref 8.4–10.5)
Chloride: 102 mEq/L (ref 96–112)
Creatinine, Ser: 0.81 mg/dL (ref 0.40–1.20)
GFR: 74.18 mL/min (ref >60.00)
Glucose, Bld: 89 mg/dL (ref 70–99)
Potassium: 4.2 mEq/L (ref 3.5–5.1)
Sodium: 138 mEq/L (ref 135–145)
Total Bilirubin: 0.8 mg/dL (ref 0.2–1.2)
Total Protein: 7.6 g/dL (ref 6.0–8.3)

## 2020-10-12 LAB — LIPID PANEL
Cholesterol: 238 mg/dL — ABNORMAL HIGH (ref 0–200)
HDL: 58.3 mg/dL (ref >39.00)
LDL Cholesterol: 167 mg/dL — ABNORMAL HIGH (ref 0–99)
NonHDL: 180.16
Total CHOL/HDL Ratio: 4
Triglycerides: 68 mg/dL (ref 0.0–149.0)
VLDL: 13.6 mg/dL (ref 0.0–40.0)

## 2020-10-12 NOTE — Progress Notes (Signed)
Subjective:   By signing my name below, I, Anna Peterson, attest that this documentation has been prepared under the direction and in the presence of Anna Alar NP. 10/12/2020    Patient ID: Anna Peterson, female    DOB: 17-Dec-1950, 70 y.o.   MRN: [id number]  Chief Complaint  Patient presents with   Annual Exam         HPI Patient is in today for a comprehensive physical exam.  She denies having any unexpected weight change, hearing loss and rhinorrhea, visual disturbance, cough, chest pain and leg swelling, nausea, vomiting, diarrhea and blood in stool, or dysuria and frequency, for myalgias and arthralgias, rash, headaches, adenopathy, depression or anxiety at this time.  She has no recent surgical procedures. She has had no recent changes to her family medical history. She does not smoke. She reports quitting 51 years ago.   Nail fungus- She is requesting a topical cream to help manage her nail fungus.  Skin rashes- She reports having a new darker skin spot on her left hand. Allergies- She has mild allergies and has cough due to post nasal drip. She manages her symptoms with OTC allergy medication.  Immunizations: She is UTD on tetanus vaccines. She is UTD on pneumonia vaccines. She has 3 pfizer Covid-19 vaccines and is not interested in getting the 2nd booster vaccine. She has not received the shingles vaccine and is not interested in getting it at this time.  Diet: She is managing a healthy diet. Exercise: She participates in exercise by walking 3-5 days for at least 30 minutes. Colonoscopy: Last completed 01/20/2007. Dexa: Last completed 11/01/2018. Results showed osteopenia. Repeat in 2 years.  Pap Smear: Last completed 09/28/2017. Results normal. Repeat in 3 years.  Mammogram: Last completed 11/07/2019. Results normal. Repeat in 1 year. She has an upcomming appointment in August.  Vision: She is not UTD on vision care at this time due to an upcoming procedure in her  eye.   Health Maintenance Due  Topic Date Due   PNA vac Low Risk Adult (2 of 2 - PCV13) 10/01/2019   COVID-19 Vaccine (4 - Booster for Pfizer series) 05/12/2020    Past Medical History:  Diagnosis Date   Allergy    Asthma    History of chicken pox    Hyperlipidemia    Migraine    OSA (obstructive sleep apnea) 11/10/2017   Severe per home study 7/19   OSA (obstructive sleep apnea) 11/10/2017   Osteopenia    Osteopenia per Dexa Scan    Past Surgical History:  Procedure Laterality Date   CESAREAN SECTION     KNEE SURGERY Left 2007   meniscus repair    Family History  Problem Relation Age of Onset   Aneurysm Mother 86       Cerebral age 6   CAD Brother 41   Pancreatic cancer Maternal Grandmother    Arthritis Maternal Grandmother    Stroke Maternal Grandfather 47   Arthritis Maternal Grandfather    Breast cancer Paternal Grandmother 70   Arthritis Paternal Grandmother    Arthritis Paternal Grandfather    CAD Brother 55    Social History   Socioeconomic History   Marital status: Married    Spouse name: Not on file   Number of children: Not on file   Years of education: Not on file   Highest education level: Not on file  Occupational History   Not on file  Tobacco Use   Smoking status:  Former    Pack years: 0.00    Types: Cigarettes    Quit date: 12/06/1970    Years since quitting: 49.8   Smokeless tobacco: Never  Vaping Use   Vaping Use: Never used  Substance and Sexual Activity   Alcohol use: Yes    Alcohol/week: 5.0 standard drinks    Types: 5 Glasses of wine per week   Drug use: No   Sexual activity: Not on file  Other Topics Concern   Not on file  Social History Narrative   Retired Marine scientist, 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  Live in 4 different states.  Oldest son Anna Peterson lives locally. Second  son Anna Peterson- lives in [place], daughter lives in [place], [place] lives on [place]   No pets++   Married for 43 years.     Social Determinants of Health   Financial Resource Strain: Not on file  Food Insecurity: Not on file  Transportation Needs: Not on file  Physical Activity: Not on file  Stress: Not on file  Social Connections: Not on file  Intimate Partner Violence: Not on file    Outpatient Medications Prior to Visit  Medication Sig Dispense Refill   albuterol (VENTOLIN HFA) 108 (90 Base) MCG/ACT inhaler INHALE 2 PUFFS BY MOUTH EVERY 6 HOURS AS NEEDED FOR WHEEZE 8.5 each 2   Calcium Carbonate-Vitamin D 600-400 MG-UNIT chew tablet Chew 1 tablet by mouth daily.     CITRUS BERGAMOT PO Take by mouth.     fluticasone (FLONASE) 50 MCG/ACT nasal spray Place 1 spray into both nostrils daily as needed for allergies or rhinitis.     Garlic 0539 MG CAPS Take 1 capsule by mouth daily.     loratadine (CLARITIN) 10 MG tablet Take 10 mg by mouth daily as needed for allergies.     Multiple Vitamins-Minerals (MULTIVITAL PO) Take 1 tablet by mouth daily. MNS3. (metabolic nutrition system)     Omega-3 Fatty Acids (FISH OIL) 1200 MG CAPS Take 1,200 mg by mouth 2 (two) times daily.     SUMAtriptan (IMITREX) 100 MG tablet Take 1 tablet (100 mg total) by mouth every 2 (two) hours as needed for migraine. May repeat in 2 hours if headache persists or recurs. 10 tablet 1   Zoster Vaccine Adjuvanted [facility]) injection Inject 0.2m IM now and again in 2-6 months. 0.5 mL 1   amoxicillin-clavulanate (AUGMENTIN) 875-125 MG tablet Take 1 tablet by mouth 2 (two) times daily. 20 tablet 0   No facility-administered medications prior to visit.    Allergies  Allergen Reactions   Cabbage     "Migraine"   Other Swelling    Peaches   Ceclor [Cefaclor] Rash   Latex Rash    Review of Systems  Constitutional:        (-)unexpected weight change (-)Adenopathy  HENT:  Negative for hearing loss.         (-)Rhinorrhea   Eyes:        (-)Visual disturbance  Respiratory:  Negative for cough.   Cardiovascular:  Negative for chest pain and leg swelling.  Gastrointestinal:  Negative for blood in stool, constipation, diarrhea, nausea and vomiting.  Genitourinary:  Negative for dysuria and frequency.  Musculoskeletal:  Negative for joint pain and myalgias.  Skin:  Negative for rash.  Neurological:  Negative for headaches.  Psychiatric/Behavioral:  Negative for depression. The patient is not nervous/anxious.       Objective:    Physical Exam Constitutional:      General: She is not in acute distress.    Appearance: Normal appearance. She is not ill-appearing.  HENT:     Head: Normocephalic and atraumatic.     Right Ear: Tympanic membrane, ear canal and external ear normal.     Left Ear: Tympanic membrane, ear canal and external ear normal.  Eyes:     Extraocular Movements: Extraocular movements intact.     Pupils: Pupils are equal, round, and reactive to light.     Comments: No nystagmus  Cardiovascular:     Rate and Rhythm: Normal rate and regular rhythm.     Pulses: Normal pulses.     Heart sounds: Normal heart sounds. No murmur heard.   No gallop.  Pulmonary:     Effort: Pulmonary effort is normal. No respiratory distress.     Breath sounds: Normal breath sounds. No wheezing, rhonchi or rales.  Chest:  Breasts:    Right: Normal.     Left: Normal.  Abdominal:     General: Bowel sounds are normal. There is no distension.     Palpations: Abdomen is soft. There is no mass.     Tenderness: There is no abdominal tenderness. There is no guarding or rebound.     Hernia: No hernia is present.  Musculoskeletal:     Comments: 5/5 strength in both upper and lower extremities  Feet:     Comments: Left great toenail under skin has thickened Lymphadenopathy:     Cervical: No cervical adenopathy.  Skin:    General: Skin is warm and dry.  Neurological:     Mental Status: She is alert  and oriented to person, place, and time.     Deep Tendon Reflexes:     Reflex Scores:      Patellar reflexes are 2+ on the right side and 2+ on the left side. Psychiatric:        Behavior: Behavior normal.    BP 127/83 (BP Location: Right Arm, Patient Position: Sitting, Cuff Size: Small)   Pulse 66   Temp 98.4 F (36.9 C) (Oral)   Resp 16   Ht _0  (1.575 m)   Wt 151 lb 9.6 oz (68.8 kg)   LMP 04/07/1997   SpO2 100%   BMI 27.73 kg/m  Wt Readings from Last 3 Encounters:  10/12/20 151 lb 9.6 oz (68.8 kg)  08/24/20 154 lb (69.9 kg)  07/13/20 155 lb (70.3 kg)       Assessment & Plan:   Problem List Items Addressed This Visit       Unprioritized   Preventative health care - Primary    Encouraged pt to continue healthy diet, exercise. Obtain routine lab work.  Declines shingrix.  Declines covid booster #2. Tetanus up to date. Mammogram scheduled. Colo up to date.        Hyperlipidemia   Relevant Orders   Comp Met (CMET)   Lipid panel     No orders of the defined types were placed in this encounter.   I, Anna Alar NP, personally preformed the services described in this documentation.  All medical record entries made by the scribe were at my direction and in my presence.  I have reviewed the chart and discharge instructions (if applicable) and agree that the record reflects my personal performance  and is accurate and complete. 10/12/2020   I,Anna Peterson,acting as a scribe for Nance Pear, NP.,have documented all relevant documentation on the behalf of Nance Pear, NP,as directed by  Nance Pear, NP while in the presence of Nance Pear, NP.   Nance Pear, NP

## 2020-10-12 NOTE — Assessment & Plan Note (Signed)
Encouraged pt to continue healthy diet, exercise. Obtain routine lab work.  Declines shingrix.  Declines covid booster #2. Tetanus up to date. Mammogram scheduled. Colo up to date.

## 2020-10-18 DIAGNOSIS — M9903 Segmental and somatic dysfunction of lumbar region: Secondary | ICD-10-CM | POA: Diagnosis not present

## 2020-10-18 DIAGNOSIS — M9905 Segmental and somatic dysfunction of pelvic region: Secondary | ICD-10-CM | POA: Diagnosis not present

## 2020-10-18 DIAGNOSIS — M9904 Segmental and somatic dysfunction of sacral region: Secondary | ICD-10-CM | POA: Diagnosis not present

## 2020-10-18 DIAGNOSIS — M62830 Muscle spasm of back: Secondary | ICD-10-CM | POA: Diagnosis not present

## 2020-10-19 DIAGNOSIS — G4733 Obstructive sleep apnea (adult) (pediatric): Secondary | ICD-10-CM | POA: Diagnosis not present

## 2020-10-26 DIAGNOSIS — H43813 Vitreous degeneration, bilateral: Secondary | ICD-10-CM | POA: Diagnosis not present

## 2020-10-26 DIAGNOSIS — H35372 Puckering of macula, left eye: Secondary | ICD-10-CM | POA: Diagnosis not present

## 2020-10-26 DIAGNOSIS — H2513 Age-related nuclear cataract, bilateral: Secondary | ICD-10-CM | POA: Diagnosis not present

## 2020-10-26 DIAGNOSIS — H5213 Myopia, bilateral: Secondary | ICD-10-CM | POA: Diagnosis not present

## 2020-10-29 DIAGNOSIS — X32XXXS Exposure to sunlight, sequela: Secondary | ICD-10-CM | POA: Diagnosis not present

## 2020-10-29 DIAGNOSIS — L821 Other seborrheic keratosis: Secondary | ICD-10-CM | POA: Diagnosis not present

## 2020-10-29 DIAGNOSIS — Z85828 Personal history of other malignant neoplasm of skin: Secondary | ICD-10-CM | POA: Diagnosis not present

## 2020-10-29 DIAGNOSIS — D1801 Hemangioma of skin and subcutaneous tissue: Secondary | ICD-10-CM | POA: Diagnosis not present

## 2020-10-29 DIAGNOSIS — L814 Other melanin hyperpigmentation: Secondary | ICD-10-CM | POA: Diagnosis not present

## 2020-10-29 DIAGNOSIS — D229 Melanocytic nevi, unspecified: Secondary | ICD-10-CM | POA: Diagnosis not present

## 2020-11-02 DIAGNOSIS — G4733 Obstructive sleep apnea (adult) (pediatric): Secondary | ICD-10-CM | POA: Diagnosis not present

## 2020-11-02 DIAGNOSIS — H35372 Puckering of macula, left eye: Secondary | ICD-10-CM | POA: Diagnosis not present

## 2020-11-09 ENCOUNTER — Telehealth: Payer: Self-pay

## 2020-11-09 DIAGNOSIS — M810 Age-related osteoporosis without current pathological fracture: Secondary | ICD-10-CM

## 2020-11-09 NOTE — Telephone Encounter (Signed)
Anna Peterson needs a referral for dexa scan for patient

## 2020-11-12 ENCOUNTER — Encounter: Payer: Self-pay | Admitting: Family

## 2020-11-12 DIAGNOSIS — Z780 Asymptomatic menopausal state: Secondary | ICD-10-CM | POA: Diagnosis not present

## 2020-11-12 DIAGNOSIS — Z1231 Encounter for screening mammogram for malignant neoplasm of breast: Secondary | ICD-10-CM | POA: Diagnosis not present

## 2020-11-12 DIAGNOSIS — M8589 Other specified disorders of bone density and structure, multiple sites: Secondary | ICD-10-CM | POA: Diagnosis not present

## 2020-11-13 DIAGNOSIS — M9903 Segmental and somatic dysfunction of lumbar region: Secondary | ICD-10-CM | POA: Diagnosis not present

## 2020-11-13 DIAGNOSIS — M9905 Segmental and somatic dysfunction of pelvic region: Secondary | ICD-10-CM | POA: Diagnosis not present

## 2020-11-13 DIAGNOSIS — M9904 Segmental and somatic dysfunction of sacral region: Secondary | ICD-10-CM | POA: Diagnosis not present

## 2020-11-13 DIAGNOSIS — M62830 Muscle spasm of back: Secondary | ICD-10-CM | POA: Diagnosis not present

## 2020-11-26 ENCOUNTER — Telehealth: Payer: Self-pay | Admitting: Family

## 2020-11-26 ENCOUNTER — Encounter: Payer: Self-pay | Admitting: Family

## 2020-11-26 NOTE — Telephone Encounter (Signed)
See mychart.  

## 2020-12-06 DIAGNOSIS — H5213 Myopia, bilateral: Secondary | ICD-10-CM | POA: Diagnosis not present

## 2020-12-06 DIAGNOSIS — H35372 Puckering of macula, left eye: Secondary | ICD-10-CM | POA: Diagnosis not present

## 2020-12-06 DIAGNOSIS — H2513 Age-related nuclear cataract, bilateral: Secondary | ICD-10-CM | POA: Diagnosis not present

## 2020-12-06 DIAGNOSIS — H43811 Vitreous degeneration, right eye: Secondary | ICD-10-CM | POA: Diagnosis not present

## 2020-12-18 DIAGNOSIS — M62830 Muscle spasm of back: Secondary | ICD-10-CM | POA: Diagnosis not present

## 2020-12-18 DIAGNOSIS — M9904 Segmental and somatic dysfunction of sacral region: Secondary | ICD-10-CM | POA: Diagnosis not present

## 2020-12-18 DIAGNOSIS — M9905 Segmental and somatic dysfunction of pelvic region: Secondary | ICD-10-CM | POA: Diagnosis not present

## 2020-12-18 DIAGNOSIS — M9903 Segmental and somatic dysfunction of lumbar region: Secondary | ICD-10-CM | POA: Diagnosis not present

## 2021-01-14 DIAGNOSIS — H35372 Puckering of macula, left eye: Secondary | ICD-10-CM | POA: Diagnosis not present

## 2021-01-15 DIAGNOSIS — M9905 Segmental and somatic dysfunction of pelvic region: Secondary | ICD-10-CM | POA: Diagnosis not present

## 2021-01-15 DIAGNOSIS — M9903 Segmental and somatic dysfunction of lumbar region: Secondary | ICD-10-CM | POA: Diagnosis not present

## 2021-01-15 DIAGNOSIS — M9904 Segmental and somatic dysfunction of sacral region: Secondary | ICD-10-CM | POA: Diagnosis not present

## 2021-01-15 DIAGNOSIS — M62830 Muscle spasm of back: Secondary | ICD-10-CM | POA: Diagnosis not present

## 2021-01-30 DIAGNOSIS — G4733 Obstructive sleep apnea (adult) (pediatric): Secondary | ICD-10-CM | POA: Diagnosis not present

## 2021-02-12 DIAGNOSIS — M9903 Segmental and somatic dysfunction of lumbar region: Secondary | ICD-10-CM | POA: Diagnosis not present

## 2021-02-12 DIAGNOSIS — M62830 Muscle spasm of back: Secondary | ICD-10-CM | POA: Diagnosis not present

## 2021-02-12 DIAGNOSIS — M9905 Segmental and somatic dysfunction of pelvic region: Secondary | ICD-10-CM | POA: Diagnosis not present

## 2021-02-12 DIAGNOSIS — M9904 Segmental and somatic dysfunction of sacral region: Secondary | ICD-10-CM | POA: Diagnosis not present

## 2021-02-19 DIAGNOSIS — M9905 Segmental and somatic dysfunction of pelvic region: Secondary | ICD-10-CM | POA: Diagnosis not present

## 2021-02-19 DIAGNOSIS — M9904 Segmental and somatic dysfunction of sacral region: Secondary | ICD-10-CM | POA: Diagnosis not present

## 2021-02-19 DIAGNOSIS — M62830 Muscle spasm of back: Secondary | ICD-10-CM | POA: Diagnosis not present

## 2021-02-19 DIAGNOSIS — M9903 Segmental and somatic dysfunction of lumbar region: Secondary | ICD-10-CM | POA: Diagnosis not present

## 2021-03-19 DIAGNOSIS — M9904 Segmental and somatic dysfunction of sacral region: Secondary | ICD-10-CM | POA: Diagnosis not present

## 2021-03-19 DIAGNOSIS — M62830 Muscle spasm of back: Secondary | ICD-10-CM | POA: Diagnosis not present

## 2021-03-19 DIAGNOSIS — M9905 Segmental and somatic dysfunction of pelvic region: Secondary | ICD-10-CM | POA: Diagnosis not present

## 2021-03-19 DIAGNOSIS — M9903 Segmental and somatic dysfunction of lumbar region: Secondary | ICD-10-CM | POA: Diagnosis not present

## 2021-04-16 DIAGNOSIS — M9903 Segmental and somatic dysfunction of lumbar region: Secondary | ICD-10-CM | POA: Diagnosis not present

## 2021-04-16 DIAGNOSIS — M9905 Segmental and somatic dysfunction of pelvic region: Secondary | ICD-10-CM | POA: Diagnosis not present

## 2021-04-16 DIAGNOSIS — M62830 Muscle spasm of back: Secondary | ICD-10-CM | POA: Diagnosis not present

## 2021-04-16 DIAGNOSIS — M9904 Segmental and somatic dysfunction of sacral region: Secondary | ICD-10-CM | POA: Diagnosis not present

## 2021-04-17 DIAGNOSIS — H43811 Vitreous degeneration, right eye: Secondary | ICD-10-CM | POA: Diagnosis not present

## 2021-04-17 DIAGNOSIS — H5213 Myopia, bilateral: Secondary | ICD-10-CM | POA: Diagnosis not present

## 2021-04-17 DIAGNOSIS — H35372 Puckering of macula, left eye: Secondary | ICD-10-CM | POA: Diagnosis not present

## 2021-05-06 DIAGNOSIS — G4733 Obstructive sleep apnea (adult) (pediatric): Secondary | ICD-10-CM | POA: Diagnosis not present

## 2021-05-14 DIAGNOSIS — M9905 Segmental and somatic dysfunction of pelvic region: Secondary | ICD-10-CM | POA: Diagnosis not present

## 2021-05-14 DIAGNOSIS — M62830 Muscle spasm of back: Secondary | ICD-10-CM | POA: Diagnosis not present

## 2021-05-14 DIAGNOSIS — M9903 Segmental and somatic dysfunction of lumbar region: Secondary | ICD-10-CM | POA: Diagnosis not present

## 2021-05-14 DIAGNOSIS — M9904 Segmental and somatic dysfunction of sacral region: Secondary | ICD-10-CM | POA: Diagnosis not present

## 2021-05-29 ENCOUNTER — Other Ambulatory Visit (HOSPITAL_BASED_OUTPATIENT_CLINIC_OR_DEPARTMENT_OTHER): Payer: Self-pay

## 2021-05-29 ENCOUNTER — Ambulatory Visit (INDEPENDENT_AMBULATORY_CARE_PROVIDER_SITE_OTHER): Payer: Medicare Other | Admitting: Family

## 2021-05-29 VITALS — BP 132/78 | HR 86 | Temp 98.3°F | Resp 16 | Ht 62.0 in | Wt 156.2 lb

## 2021-05-29 DIAGNOSIS — J029 Acute pharyngitis, unspecified: Secondary | ICD-10-CM

## 2021-05-29 DIAGNOSIS — J40 Bronchitis, not specified as acute or chronic: Secondary | ICD-10-CM

## 2021-05-29 MED ORDER — AZITHROMYCIN 250 MG PO TABS
ORAL_TABLET | ORAL | 0 refills | Status: AC
  Filled 2021-05-29: qty 6, 5d supply, fill #0

## 2021-05-29 NOTE — Patient Instructions (Signed)
Please begin azithromycin. Call if your symptoms worsen or if not improved in 1 week.

## 2021-05-29 NOTE — Progress Notes (Signed)
Subjective:   By signing my name below, I, Anna Peterson, attest that this documentation has been prepared under the direction and in the presence of Anna Alar, NP 05/29/2021    Patient ID: Anna Peterson, female    DOB: 1950/06/26, 71 y.o.   MRN: [id number]  Chief Complaint  Patient presents with   Sore Throat   Nasal Congestion    Pt states that she has noticed that mucus has turned from clear to green.     HPI Patient is in today for a office visit.  She complains of allergies for the past couple of weeks which developed into a dry cough, sore throat, post nasal drip, congestion, runny nose, coughing up yellow/green mucous. She tested negative this past Sunday, 05/26/2021. She denies having any sinus pain and/or pressure. She is interested in taking a strep throat screening.    Health Maintenance Due  Topic Date Due   Zoster Vaccines- Shingrix (1 of 2) Never done   Pneumonia Vaccine 18+ Years old (2 - PCV) 10/01/2019   COVID-19 Vaccine (4 - Booster for Pfizer series) 03/06/2020   INFLUENZA VACCINE  Never done    Past Medical History:  Diagnosis Date   Allergy    Asthma    History of chicken pox    Hyperlipidemia    Migraine    OSA (obstructive sleep apnea) 11/10/2017   Severe per home study 7/19   Osteopenia    Osteopenia per Dexa Scan    Past Surgical History:  Procedure Laterality Date   CESAREAN SECTION     KNEE SURGERY Left 2007   meniscus repair    Family History  Problem Relation Age of Onset   Aneurysm Mother 58       Cerebral age 67   CAD Brother 50   Pancreatic cancer Maternal Grandmother    Arthritis Maternal Grandmother    Stroke Maternal Grandfather 23   Arthritis Maternal Grandfather    Breast cancer Paternal Grandmother 45   Arthritis Paternal Grandmother    Arthritis Paternal Grandfather    CAD Brother 61    Social History   Socioeconomic History   Marital status: Married    Spouse name: Not on file   Number of children:  Not on file   Years of education: Not on file   Highest education level: Not on file  Occupational History   Not on file  Tobacco Use   Smoking status: Former    Types: Cigarettes    Quit date: 12/06/1970    Years since quitting: 50.5   Smokeless tobacco: Never  Vaping Use   Vaping Use: Never used  Substance and Sexual Activity   Alcohol use: Yes    Alcohol/week: 5.0 standard drinks    Types: 5 Glasses of wine per week   Drug use: No   Sexual activity: Not on file  Other Topics Concern   Not on file  Social History Narrative   Retired Marine scientist, 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  Live in 4 different states.  Oldest son Corene Cornea lives locally. Second son Shanon Brow- lives in [place], daughter lives in [place], [place] lives on [address] [place]   No pets++   Married for 43 years.     Social Determinants of Health   Financial Resource Strain:  Not on file  Food Insecurity: Not on file  Transportation Needs: Not on file  Physical Activity: Not on file  Stress: Not on file  Social Connections: Not on file  Intimate Partner Violence: Not on file    Outpatient Medications Prior to Visit  Medication Sig Dispense Refill   albuterol (VENTOLIN HFA) 108 (90 Base) MCG/ACT inhaler INHALE 2 PUFFS BY MOUTH EVERY 6 HOURS AS NEEDED FOR WHEEZE 8.5 each 2   Calcium Carbonate-Vitamin D 600-400 MG-UNIT chew tablet Chew 1 tablet by mouth daily.     CITRUS BERGAMOT PO Take by mouth.     fluticasone (FLONASE) 50 MCG/ACT nasal spray Place 1 spray into both nostrils daily as needed for allergies or rhinitis.     loratadine (CLARITIN) 10 MG tablet Take 10 mg by mouth daily as needed for allergies.     Multiple Vitamins-Minerals (MULTIVITAL PO) Take 1 tablet by mouth daily. MNS3. (metabolic nutrition system)     Omega-3 Fatty Acids (FISH OIL) 1200 MG CAPS  Take 1,200 mg by mouth 2 (two) times daily.     SUMAtriptan (IMITREX) 100 MG tablet Take 1 tablet (100 mg total) by mouth every 2 (two) hours as needed for migraine. May repeat in 2 hours if headache persists or recurs. 10 tablet 1   Zoster Vaccine Adjuvanted [facility] injection Inject 0.5mg  IM now and again in 2-6 months. 0.5 mL 1   Garlic 5361 MG CAPS Take 1 capsule by mouth daily.     No facility-administered medications prior to visit.    Allergies  Allergen Reactions   Cabbage     "Migraine"   Other Swelling    Peaches   Ceclor [Cefaclor] Rash   Latex Rash    Review of Systems  HENT:  Positive for congestion and sore throat. Negative for sinus pain.        (+)rhinorrhea  Respiratory:  Positive for cough (dry) and sputum production (yellow/green).       Objective:    Physical Exam Constitutional:      General: She is not in acute distress.    Appearance: Normal appearance. She is not ill-appearing.  HENT:     Head: Normocephalic and atraumatic.     Right Ear: External ear normal.     Left Ear: External ear normal.     Mouth/Throat:     Mouth: Mucous membranes are moist.     Pharynx: No posterior oropharyngeal erythema.     Tonsils: No tonsillar exudate or tonsillar abscesses. 1+ on the right. 1+ on the left.  Eyes:     Extraocular Movements: Extraocular movements intact.     Right eye: No nystagmus.     Left eye: No nystagmus.     Pupils: Pupils are equal, round, and reactive to light.  Cardiovascular:     Rate and Rhythm: Normal rate and regular rhythm.     Heart sounds: Normal heart sounds. No murmur heard.   No gallop.  Pulmonary:     Effort: Pulmonary effort is normal. No respiratory distress.     Breath sounds: Normal breath sounds. No wheezing or rales.  Skin:    General: Skin is warm and dry.  Neurological:     Mental Status: She is alert and oriented to person, place, and time.  Psychiatric:        Behavior: Behavior normal.    BP 132/78    Pulse  86    Temp 98.3 F (36.8 C)    Resp 16  Ht 5\' 2"  (1.575 m)    Wt 156 lb 3.2 oz (70.9 kg)    LMP 04/07/1997    SpO2 98%    BMI 28.57 kg/m  Wt Readings from Last 3 Encounters:  05/29/21 156 lb 3.2 oz (70.9 kg)  10/12/20 151 lb 9.6 oz (68.8 kg)  08/24/20 154 lb (69.9 kg)       Assessment & Plan:   Problem List Items Addressed This Visit       Unprioritized   Sore throat - Primary    New. Will obtain strep swab.      Relevant Orders   Culture, Group A Strep   Bronchitis    New. Given duration/worsening of sx's, will plan rx with azithromycin.         Meds ordered this encounter  Medications   azithromycin (ZITHROMAX) 250 MG tablet    Sig: Take 2 tablets by mouth on day 1, then take 1 tablet daily on days 2 through 5    Dispense:  6 tablet    Refill:  0    Order Specific Question:   Supervising Provider    Answer:   Pat Patrick, NP, personally preformed the services described in this documentation.  All medical record entries made by the scribe were at my direction and in my presence.  I have reviewed the chart and discharge instructions (if applicable) and agree that the record reflects my personal performance and is accurate and complete. 05/29/2021   I,Anna Peterson,acting as a scribe for Nance Pear, NP.,have documented all relevant documentation on the behalf of Nance Pear, NP,as directed by  Nance Pear, NP while in the presence of Nance Pear, NP.   Nance Pear, NP

## 2021-05-30 DIAGNOSIS — J40 Bronchitis, not specified as acute or chronic: Secondary | ICD-10-CM | POA: Insufficient documentation

## 2021-05-30 DIAGNOSIS — J029 Acute pharyngitis, unspecified: Secondary | ICD-10-CM | POA: Insufficient documentation

## 2021-05-30 NOTE — Assessment & Plan Note (Signed)
New. Given duration/worsening of sx's, will plan rx with azithromycin.

## 2021-05-30 NOTE — Assessment & Plan Note (Signed)
New. Will obtain strep swab.

## 2021-05-31 LAB — CULTURE, GROUP A STREP
MICRO NUMBER:: 13042469
SPECIMEN QUALITY:: ADEQUATE

## 2021-06-11 DIAGNOSIS — M62830 Muscle spasm of back: Secondary | ICD-10-CM | POA: Diagnosis not present

## 2021-06-11 DIAGNOSIS — M9904 Segmental and somatic dysfunction of sacral region: Secondary | ICD-10-CM | POA: Diagnosis not present

## 2021-06-11 DIAGNOSIS — M9905 Segmental and somatic dysfunction of pelvic region: Secondary | ICD-10-CM | POA: Diagnosis not present

## 2021-06-11 DIAGNOSIS — M9903 Segmental and somatic dysfunction of lumbar region: Secondary | ICD-10-CM | POA: Diagnosis not present

## 2021-07-05 DIAGNOSIS — H524 Presbyopia: Secondary | ICD-10-CM | POA: Diagnosis not present

## 2021-07-05 DIAGNOSIS — H5371 Glare sensitivity: Secondary | ICD-10-CM | POA: Diagnosis not present

## 2021-07-05 DIAGNOSIS — H25012 Cortical age-related cataract, left eye: Secondary | ICD-10-CM | POA: Diagnosis not present

## 2021-07-05 DIAGNOSIS — H5213 Myopia, bilateral: Secondary | ICD-10-CM | POA: Diagnosis not present

## 2021-07-05 DIAGNOSIS — H43811 Vitreous degeneration, right eye: Secondary | ICD-10-CM | POA: Diagnosis not present

## 2021-07-05 DIAGNOSIS — H2513 Age-related nuclear cataract, bilateral: Secondary | ICD-10-CM | POA: Diagnosis not present

## 2021-07-05 DIAGNOSIS — H52223 Regular astigmatism, bilateral: Secondary | ICD-10-CM | POA: Diagnosis not present

## 2021-07-05 DIAGNOSIS — H35372 Puckering of macula, left eye: Secondary | ICD-10-CM | POA: Diagnosis not present

## 2021-07-15 DIAGNOSIS — H2512 Age-related nuclear cataract, left eye: Secondary | ICD-10-CM | POA: Diagnosis not present

## 2021-07-17 DIAGNOSIS — H35372 Puckering of macula, left eye: Secondary | ICD-10-CM | POA: Diagnosis not present

## 2021-08-02 ENCOUNTER — Telehealth: Payer: Self-pay | Admitting: Family

## 2021-08-02 NOTE — Telephone Encounter (Signed)
Pt called stating that she had received a message about scheduling her annual appt with [place]. After reviewing her chart, scheduled her for physical in July meeting +1y1d criteria. After inquiring about their AWV, they stated that they had that as a home visit from [facility] on 1.20.23. Are we able to schedule her for an initial visit with [place] this year or will we have to wait +1y1d after the last AWV due to insurance coverage? Please Advise. ?

## 2021-08-06 DIAGNOSIS — G4733 Obstructive sleep apnea (adult) (pediatric): Secondary | ICD-10-CM | POA: Diagnosis not present

## 2021-08-13 DIAGNOSIS — M9905 Segmental and somatic dysfunction of pelvic region: Secondary | ICD-10-CM | POA: Diagnosis not present

## 2021-08-13 DIAGNOSIS — M9904 Segmental and somatic dysfunction of sacral region: Secondary | ICD-10-CM | POA: Diagnosis not present

## 2021-08-13 DIAGNOSIS — M9903 Segmental and somatic dysfunction of lumbar region: Secondary | ICD-10-CM | POA: Diagnosis not present

## 2021-08-13 DIAGNOSIS — M62830 Muscle spasm of back: Secondary | ICD-10-CM | POA: Diagnosis not present

## 2021-09-06 DIAGNOSIS — H35372 Puckering of macula, left eye: Secondary | ICD-10-CM | POA: Diagnosis not present

## 2021-09-10 DIAGNOSIS — M9905 Segmental and somatic dysfunction of pelvic region: Secondary | ICD-10-CM | POA: Diagnosis not present

## 2021-09-10 DIAGNOSIS — M62830 Muscle spasm of back: Secondary | ICD-10-CM | POA: Diagnosis not present

## 2021-09-10 DIAGNOSIS — M9903 Segmental and somatic dysfunction of lumbar region: Secondary | ICD-10-CM | POA: Diagnosis not present

## 2021-09-10 DIAGNOSIS — M9904 Segmental and somatic dysfunction of sacral region: Secondary | ICD-10-CM | POA: Diagnosis not present

## 2021-09-19 DIAGNOSIS — M9905 Segmental and somatic dysfunction of pelvic region: Secondary | ICD-10-CM | POA: Diagnosis not present

## 2021-09-19 DIAGNOSIS — M9904 Segmental and somatic dysfunction of sacral region: Secondary | ICD-10-CM | POA: Diagnosis not present

## 2021-09-19 DIAGNOSIS — M9903 Segmental and somatic dysfunction of lumbar region: Secondary | ICD-10-CM | POA: Diagnosis not present

## 2021-09-19 DIAGNOSIS — M62830 Muscle spasm of back: Secondary | ICD-10-CM | POA: Diagnosis not present

## 2021-09-27 DIAGNOSIS — H16223 Keratoconjunctivitis sicca, not specified as Sjogren's, bilateral: Secondary | ICD-10-CM | POA: Diagnosis not present

## 2021-10-07 DIAGNOSIS — M9903 Segmental and somatic dysfunction of lumbar region: Secondary | ICD-10-CM | POA: Diagnosis not present

## 2021-10-07 DIAGNOSIS — M9904 Segmental and somatic dysfunction of sacral region: Secondary | ICD-10-CM | POA: Diagnosis not present

## 2021-10-07 DIAGNOSIS — M9905 Segmental and somatic dysfunction of pelvic region: Secondary | ICD-10-CM | POA: Diagnosis not present

## 2021-10-07 DIAGNOSIS — M62830 Muscle spasm of back: Secondary | ICD-10-CM | POA: Diagnosis not present

## 2021-10-15 ENCOUNTER — Telehealth: Payer: Self-pay | Admitting: Family

## 2021-10-15 ENCOUNTER — Encounter: Payer: Self-pay | Admitting: Family

## 2021-10-15 ENCOUNTER — Ambulatory Visit (INDEPENDENT_AMBULATORY_CARE_PROVIDER_SITE_OTHER): Payer: Medicare Other | Admitting: Family

## 2021-10-15 VITALS — BP 120/90 | HR 68 | Temp 97.9°F | Resp 18 | Ht 62.0 in | Wt 157.2 lb

## 2021-10-15 DIAGNOSIS — G4733 Obstructive sleep apnea (adult) (pediatric): Secondary | ICD-10-CM

## 2021-10-15 DIAGNOSIS — B351 Tinea unguium: Secondary | ICD-10-CM

## 2021-10-15 DIAGNOSIS — J45909 Unspecified asthma, uncomplicated: Secondary | ICD-10-CM

## 2021-10-15 DIAGNOSIS — E785 Hyperlipidemia, unspecified: Secondary | ICD-10-CM

## 2021-10-15 DIAGNOSIS — Z1231 Encounter for screening mammogram for malignant neoplasm of breast: Secondary | ICD-10-CM

## 2021-10-15 DIAGNOSIS — Z0000 Encounter for general adult medical examination without abnormal findings: Secondary | ICD-10-CM

## 2021-10-15 LAB — COMPREHENSIVE METABOLIC PANEL
ALT: 20 U/L (ref 0–35)
AST: 24 U/L (ref 0–37)
Albumin: 4.8 g/dL (ref 3.5–5.2)
Alkaline Phosphatase: 66 U/L (ref 39–117)
BUN: 12 mg/dL (ref 6–23)
CO2: 27 mEq/L (ref 19–32)
Calcium: 10 mg/dL (ref 8.4–10.5)
Chloride: 102 mEq/L (ref 96–112)
Creatinine, Ser: 0.78 mg/dL (ref 0.40–1.20)
GFR: 77.06 mL/min (ref >60.00)
Glucose, Bld: 98 mg/dL (ref 70–99)
Potassium: 4 mEq/L (ref 3.5–5.1)
Sodium: 140 mEq/L (ref 135–145)
Total Bilirubin: 0.8 mg/dL (ref 0.2–1.2)
Total Protein: 7.7 g/dL (ref 6.0–8.3)

## 2021-10-15 LAB — LIPID PANEL
Cholesterol: 267 mg/dL — ABNORMAL HIGH (ref 0–200)
HDL: 64.5 mg/dL (ref >39.00)
LDL Cholesterol: 186 mg/dL — ABNORMAL HIGH (ref 0–99)
NonHDL: 202.2
Total CHOL/HDL Ratio: 4
Triglycerides: 81 mg/dL (ref 0.0–149.0)
VLDL: 16.2 mg/dL (ref 0.0–40.0)

## 2021-10-15 MED ORDER — SHINGRIX 50 MCG/0.5ML IM SUSR: INTRAMUSCULAR | 1 refills | Status: DC

## 2021-10-15 MED ORDER — ALBUTEROL SULFATE HFA 108 (90 BASE) MCG/ACT IN AERS: INHALATION_SPRAY | RESPIRATORY_TRACT | 2 refills | Status: DC

## 2021-10-15 NOTE — Assessment & Plan Note (Signed)
Declines antifungal oral treatment.

## 2021-10-15 NOTE — Assessment & Plan Note (Addendum)
Feeling better on CPAP. Needs to schedule follow up with Dr. Ander Slade (sleep medicine).

## 2021-10-15 NOTE — Assessment & Plan Note (Signed)
Stable with prn albuterol.  

## 2021-10-15 NOTE — Progress Notes (Signed)
Subjective:   By signing my name below, I, Kellie Simmering, attest that this documentation has been prepared under the direction and in the presence of [name], NP 10/15/2021.   Patient ID: Anna Peterson, female    DOB: 11/27/1950, 71 y.o.   MRN: [id number]  Chief Complaint  Patient presents with   Annual Exam    Pt states fasting     HPI Patient is in today for a comprehensive physical exam. She denies having any fever, new moles, congestion, sinus pain, sore throat, chest pain, palpitations, cough, shortness of breath, wheezing, nausea, vomiting, diarrhea, constipation, dysuria, frequency, abdominal pain, hematuria, new muscle pain, new joint pain, headaches.  Knee pain- She complains of right knee pain.  Minor wheezing- She reports minor wheezing due to the bad air quality. Has used albuterol prn with good relief of symptoms.  Social history- She reports no changes to her family history. Both paternal and maternal grandparents had arthritis. Her father is 57 years-old. She drinks about 5 alcoholic beverages a week. She does not consume any tobacco or vape products.  Immunizations: She had a pneumonia vaccination in 2020 but declines an updated vaccination at this time due to concern that she might fall sick. She does not want a COVID-19 booster vaccination. She is open to receiving a shingles immunization at [facility]. Her tetanus immunizations are up to date.   Diet: She rarely eats fried foods and mostly eats chicken and fish.  Colonoscopy: Last completed on 01/20/2007. Due.  Dexa: Last completed on 11/12/2020.   Pap Smear: Last completed on 04/07/2013. Normal results.  Mammogram: Last completed on 11/07/2019 on file. She reports that she had another last year from Aug-Sep. Due  Dental: Her dental care is up to date.  Vision: She had a vitrectomy on her left eye last year. She received surgery for her cataracts. She has an upcoming cataracts surgery in  December. She reports that she does not have clear central vision, but she does have clear peripheral vision. She also reports that removing her glasses can occasionally help her see better.  Blood pressure-  Her blood pressure has been within normal ranges. BP Readings from Last 3 Encounters:  10/15/21 120/90  05/29/21 132/78  10/12/20 127/83   Pulse Readings from Last 3 Encounters:  10/15/21 68  05/29/21 86  10/12/20 66   Cholesterol- She is fasting today. Lab Results  Component Value Date   CHOL 238 (H) 10/12/2020   HDL 58.30 10/12/2020   LDLCALC 167 (H) 10/12/2020   TRIG 68.0 10/12/2020   CHOLHDL 4 10/12/2020    Migraines- She reports that her CPAP has helped to manage her migraines.    Past Medical History:  Diagnosis Date   Allergy    Asthma    History of chicken pox    Hyperlipidemia    Migraine    OSA (obstructive sleep apnea) 11/10/2017   Severe per home study 7/19   Osteopenia    Osteopenia per Dexa Scan    Past Surgical History:  Procedure Laterality Date   CESAREAN SECTION     EYE SURGERY     cataract surgery, vitreous removal 2022   KNEE SURGERY Left 2007   meniscus repair    Family History  Problem Relation Age of Onset   Aneurysm Mother 52       Cerebral age 47   CAD Brother 50   Pancreatic cancer Maternal Grandmother    Arthritis Maternal Grandmother  Stroke Maternal Grandfather 47   Arthritis Maternal Grandfather    Breast cancer Paternal Grandmother 50   Arthritis Paternal Grandmother    Arthritis Paternal Grandfather    CAD Brother 69    Social History   Socioeconomic History   Marital status: Married    Spouse name: Not on file   Number of children: Not on file   Years of education: Not on file   Highest education level: Not on file  Occupational History   Not on file  Tobacco Use   Smoking status: Former    Types: Cigarettes    Quit date: 12/06/1970    Years since quitting: 50.8   Smokeless tobacco: Never  Vaping Use    Vaping Use: Never used  Substance and Sexual Activity   Alcohol use: Yes    Alcohol/week: 5.0 standard drinks of alcohol    Types: 5 Glasses of wine per week   Drug use: No   Sexual activity: Not on file  Other Topics Concern   Not on file  Social History Narrative   Retired Marine scientist, 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  Live in 4 different states.  Oldest son Corene Cornea lives locally. Second son Shanon Brow- lives in [place], daughter lives in [place], [place] lives on [place]   No pets++   Married for 43 years.     Social Determinants of Health   Financial Resource Strain: Not on file  Food Insecurity: Not on file  Transportation Needs: Not on file  Physical Activity: Not on file  Stress: Not on file  Social Connections: Not on file  Intimate Partner Violence: Not on file    Outpatient Medications Prior to Visit  Medication Sig Dispense Refill   Calcium Carbonate-Vitamin D 600-400 MG-UNIT chew tablet Chew 1 tablet by mouth daily.     CITRUS BERGAMOT PO Take by mouth.     fluticasone (FLONASE) 50 MCG/ACT nasal spray Place 1 spray into both nostrils daily as needed for allergies or rhinitis.     loratadine (CLARITIN) 10 MG tablet Take 10 mg by mouth daily as needed for allergies.     Multiple Vitamins-Minerals (MULTIVITAL PO) Take 1 tablet by mouth daily. MNS3. (metabolic nutrition system)     Omega-3 Fatty Acids (FISH OIL) 1200 MG CAPS Take 1,200 mg by mouth 2 (two) times daily.     SUMAtriptan (IMITREX) 100 MG tablet Take 1 tablet (100 mg total) by mouth every 2 (two) hours as needed for migraine. May repeat in 2 hours if headache persists or recurs. 10 tablet 1   albuterol (VENTOLIN HFA) 108 (90 Base) MCG/ACT inhaler INHALE 2 PUFFS BY MOUTH EVERY 6 HOURS AS NEEDED FOR WHEEZE 8.5 each 2   No facility-administered  medications prior to visit.    Allergies  Allergen Reactions   Cabbage     "Migraine"   Other Swelling    Peaches   Ceclor [Cefaclor] Rash   Latex Rash    Review of Systems  Constitutional:  Negative for chills and fever.  HENT:  Negative for congestion and sinus pain.   Respiratory:  Negative for cough, shortness of breath and wheezing.   Cardiovascular:  Negative for chest pain.  Gastrointestinal:  Negative for abdominal pain, constipation, diarrhea, nausea and vomiting.  Genitourinary:  Negative for  dysuria, frequency, hematuria and urgency.  Musculoskeletal:  Negative for myalgias.       (+) right knee pain  Skin:        (-) new moles  Neurological:  Negative for headaches.  Psychiatric/Behavioral:  Negative for depression.        Objective:    Physical Exam Constitutional:      Appearance: Normal appearance. She is not ill-appearing.  HENT:     Head: Normocephalic and atraumatic.     Right Ear: Tympanic membrane, ear canal and external ear normal.     Left Ear: Tympanic membrane, ear canal and external ear normal.  Eyes:     Extraocular Movements: Extraocular movements intact.     Right eye: No nystagmus.     Left eye: No nystagmus.     Pupils: Pupils are equal, round, and reactive to light.  Cardiovascular:     Rate and Rhythm: Normal rate and regular rhythm.     Pulses: Normal pulses.     Heart sounds: Normal heart sounds. No murmur heard.    No gallop.  Pulmonary:     Effort: Pulmonary effort is normal. No respiratory distress.     Breath sounds: Normal breath sounds. No wheezing.  Abdominal:     General: Bowel sounds are normal.     Palpations: Abdomen is soft.     Tenderness: There is no abdominal tenderness. There is no guarding.  Musculoskeletal:     Comments: Muscle strength 5/5 on upper and lower extremities.  Feet:     Comments: Minor onychomycosis bilaterally on pinky toes and right great toe Skin:    General: Skin is warm and dry.   Neurological:     Mental Status: She is alert and oriented to person, place, and time.     Deep Tendon Reflexes:     Reflex Scores:      Patellar reflexes are 2+ on the right side and 2+ on the left side. Psychiatric:        Judgment: Judgment normal.     BP 120/90 (BP Location: Right Arm, Patient Position: Sitting, Cuff Size: Normal)   Pulse 68   Temp 97.9 F (36.6 C) (Oral)   Resp 18   Ht '5\' 2"'  (1.575 m)   Wt 157 lb 3.2 oz (71.3 kg)   LMP 04/07/1997   SpO2 98%   BMI 28.75 kg/m  Wt Readings from Last 3 Encounters:  10/15/21 157 lb 3.2 oz (71.3 kg)  05/29/21 156 lb 3.2 oz (70.9 kg)  10/12/20 151 lb 9.6 oz (68.8 kg)      Assessment & Plan:   Problem List Items Addressed This Visit       Unprioritized   Preventative health care - Primary    Continue healthy diet, exercise.   Wt Readings from Last 3 Encounters:  10/15/21 157 lb 3.2 oz (71.3 kg)  05/29/21 156 lb 3.2 oz (70.9 kg)  10/12/20 151 lb 9.6 oz (68.8 kg)  Will [facility]'s [facility]. [facility]. Order placed. She wishes to defer prevnar 20 until her visit with family is over. She will schedule a nurse visit. [place] up to date. Declines covid booster.        OSA (obstructive sleep apnea)    Feeling better on CPAP. Needs to schedule follow up with Dr. Ander Slade (sleep medicine).       Onychomycosis    Declines antifungal oral treatment.  Relevant Medications   Zoster Vaccine Adjuvanted [facility]) injection   Hyperlipidemia    Not on statin, continues to work on diet.        Relevant Orders   Lipid panel   Comp Met (CMET)   Asthma    Stable with prn albuterol.       Relevant Medications   albuterol (VENTOLIN HFA) 108 (90 Base) MCG/ACT inhaler   Other Visit Diagnoses     Encounter for screening mammogram for malignant neoplasm of breast       Relevant Orders   MM 3D SCREEN BREAST BILATERAL       Meds ordered this encounter  Medications    albuterol (VENTOLIN HFA) 108 (90 Base) MCG/ACT inhaler    Sig: INHALE 2 PUFFS BY MOUTH EVERY 6 HOURS AS NEEDED FOR WHEEZE    Dispense:  8.5 each    Refill:  2    PATIENT REQUESTING NEW RX AT THIS TIME. THANK YOU   Zoster Vaccine Adjuvanted [facility]) injection    Sig: Inject 0.5m IM now and again in 2-6  months    Dispense:  0.5 mL    Refill:  1    Order Specific Question:   Supervising Provider    Answer:   BPenni HomansA [[id number]]    I, MNance Pear NP, personally preformed the services described in this documentation.  All medical record entries made by the scribe were at my direction and in my presence.  I have reviewed the chart and discharge instructions (if applicable) and agree that the record reflects my personal performance and is accurate and complete. 10/15/2021.   I,Mohammed Iqbal,acting as a sEducation administratorfor [employer] NP.,have documented all relevant documentation on the behalf of MNance Pear NP,as directed by  MNance Pear NP while in the presence of MNance Pear NP.    MNance Pear NP

## 2021-10-15 NOTE — Assessment & Plan Note (Signed)
Not on statin, continues to work on diet.

## 2021-10-15 NOTE — Assessment & Plan Note (Addendum)
Continue healthy diet, exercise.   Wt Readings from Last 3 Encounters:  10/15/21 157 lb 3.2 oz (71.3 kg)  05/29/21 156 lb 3.2 oz (70.9 kg)  10/12/20 151 lb 9.6 oz (68.8 kg)   Will request last [facility]'s mammogram from [facility]. [facility]. Order placed. She wishes to defer prevnar 20 until her visit with family is over. She will schedule a nurse visit. Colo up to date. Declines covid booster.

## 2021-10-15 NOTE — Telephone Encounter (Signed)
Please call Anna Peterson and request a copy of her last mammogram.

## 2021-10-15 NOTE — Telephone Encounter (Signed)
Request sent to [facility] for mammogram.

## 2021-10-16 ENCOUNTER — Telehealth: Payer: Self-pay | Admitting: Family

## 2021-10-16 NOTE — Telephone Encounter (Signed)
See mychart.  

## 2021-10-18 MED ORDER — ROSUVASTATIN CALCIUM 10 MG PO TABS: 10.0000 mg | ORAL_TABLET | Freq: Every day | ORAL | 1 refills | Status: DC

## 2021-10-18 NOTE — Addendum Note (Signed)
Addended by: [name] on: 10/18/2021 10:56 AM   Modules accepted: Orders

## 2021-10-29 DIAGNOSIS — B351 Tinea unguium: Secondary | ICD-10-CM | POA: Diagnosis not present

## 2021-10-29 DIAGNOSIS — D1801 Hemangioma of skin and subcutaneous tissue: Secondary | ICD-10-CM | POA: Diagnosis not present

## 2021-10-29 DIAGNOSIS — Z85828 Personal history of other malignant neoplasm of skin: Secondary | ICD-10-CM | POA: Diagnosis not present

## 2021-10-29 DIAGNOSIS — X32XXXS Exposure to sunlight, sequela: Secondary | ICD-10-CM | POA: Diagnosis not present

## 2021-10-29 DIAGNOSIS — L814 Other melanin hyperpigmentation: Secondary | ICD-10-CM | POA: Diagnosis not present

## 2021-10-29 DIAGNOSIS — D225 Melanocytic nevi of trunk: Secondary | ICD-10-CM | POA: Diagnosis not present

## 2021-11-01 ENCOUNTER — Ambulatory Visit (INDEPENDENT_AMBULATORY_CARE_PROVIDER_SITE_OTHER): Payer: Medicare Other

## 2021-11-01 DIAGNOSIS — Z23 Encounter for immunization: Secondary | ICD-10-CM | POA: Diagnosis not present

## 2021-11-01 NOTE — Progress Notes (Signed)
Anna Peterson is a 71 y.o. female presents to the office today for [place], per physician's orders. Original order: 10/15/21 Prevnar20 was administered IM L Deltoid today. Patient tolerated injection. Patient due for follow up labs/provider appt: No.     Creft, Kristine Garbe L

## 2021-11-05 DIAGNOSIS — M62830 Muscle spasm of back: Secondary | ICD-10-CM | POA: Diagnosis not present

## 2021-11-05 DIAGNOSIS — M9904 Segmental and somatic dysfunction of sacral region: Secondary | ICD-10-CM | POA: Diagnosis not present

## 2021-11-05 DIAGNOSIS — M9905 Segmental and somatic dysfunction of pelvic region: Secondary | ICD-10-CM | POA: Diagnosis not present

## 2021-11-05 DIAGNOSIS — M9903 Segmental and somatic dysfunction of lumbar region: Secondary | ICD-10-CM | POA: Diagnosis not present

## 2021-11-12 ENCOUNTER — Ambulatory Visit (HOSPITAL_BASED_OUTPATIENT_CLINIC_OR_DEPARTMENT_OTHER)
Admission: RE | Admit: 2021-11-12 | Discharge: 2021-11-12 | Disposition: A | Discharge status: Home / Self Care | Payer: Medicare Other | Source: Ambulatory Visit | Attending: Family | Admitting: Family

## 2021-11-12 ENCOUNTER — Encounter (HOSPITAL_BASED_OUTPATIENT_CLINIC_OR_DEPARTMENT_OTHER): Payer: Self-pay

## 2021-11-12 DIAGNOSIS — Z1231 Encounter for screening mammogram for malignant neoplasm of breast: Secondary | ICD-10-CM | POA: Insufficient documentation

## 2021-11-12 DIAGNOSIS — G4733 Obstructive sleep apnea (adult) (pediatric): Secondary | ICD-10-CM | POA: Diagnosis not present

## 2021-12-03 DIAGNOSIS — H26492 Other secondary cataract, left eye: Secondary | ICD-10-CM | POA: Diagnosis not present

## 2021-12-05 DIAGNOSIS — M25521 Pain in right elbow: Secondary | ICD-10-CM | POA: Diagnosis not present

## 2021-12-10 ENCOUNTER — Telehealth: Payer: Self-pay | Admitting: Family

## 2021-12-10 NOTE — Telephone Encounter (Signed)
Please advise [facility] that Dr. Ander Slade is the provider prescribing her CPAP.  They should send order requests to him. Please advise pt to schedule a follow up visit with him as it appears she is overdue.

## 2021-12-11 NOTE — Telephone Encounter (Signed)
Per patient she does not need any supplies for her CPAP machine at this time. She was advised to call pulmonology for follow up.

## 2021-12-20 DIAGNOSIS — H524 Presbyopia: Secondary | ICD-10-CM | POA: Diagnosis not present

## 2021-12-20 DIAGNOSIS — H35372 Puckering of macula, left eye: Secondary | ICD-10-CM | POA: Diagnosis not present

## 2021-12-20 DIAGNOSIS — H40002 Preglaucoma, unspecified, left eye: Secondary | ICD-10-CM | POA: Diagnosis not present

## 2021-12-20 DIAGNOSIS — Z961 Presence of intraocular lens: Secondary | ICD-10-CM | POA: Diagnosis not present

## 2021-12-20 DIAGNOSIS — H52223 Regular astigmatism, bilateral: Secondary | ICD-10-CM | POA: Diagnosis not present

## 2021-12-20 DIAGNOSIS — H5213 Myopia, bilateral: Secondary | ICD-10-CM | POA: Diagnosis not present

## 2021-12-30 ENCOUNTER — Encounter: Payer: Self-pay | Admitting: Family

## 2021-12-30 NOTE — Telephone Encounter (Signed)
Patient scheduled for 01-01-22

## 2022-01-01 ENCOUNTER — Ambulatory Visit (INDEPENDENT_AMBULATORY_CARE_PROVIDER_SITE_OTHER): Payer: Medicare Other | Admitting: Family

## 2022-01-01 VITALS — BP 148/76 | HR 68 | Resp 18 | Ht 62.0 in | Wt 160.0 lb

## 2022-01-01 DIAGNOSIS — E785 Hyperlipidemia, unspecified: Secondary | ICD-10-CM | POA: Diagnosis not present

## 2022-01-01 DIAGNOSIS — R200 Anesthesia of skin: Secondary | ICD-10-CM | POA: Diagnosis not present

## 2022-01-01 LAB — LIPID PANEL
Cholesterol: 194 mg/dL (ref 0–200)
HDL: 62.2 mg/dL (ref >39.00)
LDL Cholesterol: 114 mg/dL — ABNORMAL HIGH (ref 0–99)
NonHDL: 132.08
Total CHOL/HDL Ratio: 3
Triglycerides: 92 mg/dL (ref 0.0–149.0)
VLDL: 18.4 mg/dL (ref 0.0–40.0)

## 2022-01-01 LAB — HEPATIC FUNCTION PANEL
ALT: 26 U/L (ref 0–35)
AST: 20 U/L (ref 0–37)
Albumin: 4.6 g/dL (ref 3.5–5.2)
Alkaline Phosphatase: 65 U/L (ref 39–117)
Bilirubin, Direct: 0.1 mg/dL (ref 0.0–0.3)
Total Bilirubin: 0.7 mg/dL (ref 0.2–1.2)
Total Protein: 7.4 g/dL (ref 6.0–8.3)

## 2022-01-01 NOTE — Assessment & Plan Note (Signed)
Encouraged her to restart statin. She would like to check lipid panel first.

## 2022-01-01 NOTE — Progress Notes (Signed)
Subjective:     Patient ID: Anna Peterson, female    DOB: Apr 07, 1951, 71 y.o.   MRN: [id number]  Chief Complaint  Patient presents with   Follow-up    HPI Health Maintenance Due  Topic Date Due   Zoster Vaccines- Shingrix (1 of 2) Never done   COVID-19 Vaccine (4 - Pfizer series) 03/06/2020   INFLUENZA VACCINE  Never done    Past Medical History:  Diagnosis Date   Allergy    Asthma    History of chicken pox    Hyperlipidemia    Migraine    OSA (obstructive sleep apnea) 11/10/2017   Severe per home study 7/19   Osteopenia    Osteopenia per Dexa Scan    Past Surgical History:  Procedure Laterality Date   CESAREAN SECTION     EYE SURGERY     cataract surgery, vitreous removal 2022   KNEE SURGERY Left 2007   meniscus repair    Family History  Problem Relation Age of Onset   Aneurysm Mother 60       Cerebral age 60   CAD Brother 71   Pancreatic cancer Maternal Grandmother    Arthritis Maternal Grandmother    Stroke Maternal Grandfather 32   Arthritis Maternal Grandfather    Breast cancer Paternal Grandmother 8   Arthritis Paternal Grandmother    Arthritis Paternal Grandfather    CAD Brother 41    Social History   Socioeconomic History   Marital status: Married    Spouse name: Not on file   Number of children: Not on file   Years of education: Not on file   Highest education level: Not on file  Occupational History   Not on file  Tobacco Use   Smoking status: Former    Types: Cigarettes    Quit date: 12/06/1970    Years since quitting: 51.1   Smokeless tobacco: Never  Vaping Use   Vaping Use: Never used  Substance and Sexual Activity   Alcohol use: Yes    Alcohol/week: 5.0 standard drinks of alcohol    Types: 5 Glasses of wine per week   Drug use: No   Sexual activity: Not on file  Other Topics Concern   Not on file  Social History Narrative   Retired Marine scientist, 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  Live in 4 different states.  Oldest son Corene Cornea lives locally. Second son Shanon Brow- lives in [place], daughter lives in [place], [place] lives on [place]   No pets++   Married for 43 years.     Social Determinants of Health   Financial Resource Strain: Not on file  Food Insecurity: Not on file  Transportation Needs: Not on file  Physical Activity: Not on file  Stress: Not on file  Social Connections: Not on file  Intimate Partner Violence: Not on file    Outpatient Medications Prior to Visit  Medication Sig Dispense Refill   albuterol (VENTOLIN HFA) 108 (90 Base) MCG/ACT inhaler INHALE 2 PUFFS BY MOUTH EVERY 6 HOURS AS NEEDED FOR WHEEZE 8.5 each 2   Calcium Carbonate-Vitamin D 600-400 MG-UNIT chew tablet Chew 1 tablet by mouth daily.     CITRUS BERGAMOT PO Take by mouth.     fluticasone (FLONASE) 50 MCG/ACT nasal spray Place  1 spray into both nostrils daily as needed for allergies or rhinitis.     loratadine (CLARITIN) 10 MG tablet Take 10 mg by mouth daily as needed for allergies.     Multiple Vitamins-Minerals (MULTIVITAL PO) Take 1 tablet by mouth daily. MNS3. (metabolic nutrition system)     Omega-3 Fatty Acids (FISH OIL) 1200 MG CAPS Take 1,200 mg by mouth 2 (two) times daily.     rosuvastatin (CRESTOR) 10 MG tablet Take 1 tablet (10 mg total) by mouth daily. 90 tablet 1   SUMAtriptan (IMITREX) 100 MG tablet Take 1 tablet (100 mg total) by mouth every 2 (two) hours as needed for migraine. May repeat in 2 hours if headache persists or recurs. 10 tablet 1   Zoster Vaccine Adjuvanted [facility]) injection Inject 0.48m IM now and again in 2-6  months 0.5 mL 1   No facility-administered medications prior to visit.    Allergies  Allergen Reactions   Cabbage     "Migraine"   Other Swelling    Peaches   Ceclor [Cefaclor] Rash   Latex Rash    ROS     Objective:     Physical Exam  BP (!) 148/76   Pulse 68   Resp 18   Ht '5\' 2"'$  (1.575 m)   Wt 160 lb (72.6 kg)   LMP 04/07/1997   SpO2 99%   BMI 29.26 kg/m  Wt Readings from Last 3 Encounters:  01/01/22 160 lb (72.6 kg)  10/15/21 157 lb 3.2 oz (71.3 kg)  05/29/21 156 lb 3.2 oz (70.9 kg)       Assessment & Plan:   Problem List Items Addressed This Visit       Unprioritized   Hyperlipidemia - Primary   Relevant Orders   Hepatic function panel   Lipid panel    I am having DThea Gistmaintain her fluticasone, loratadine, Multiple Vitamins-Minerals (MULTIVITAL PO), Fish Oil, SUMAtriptan, Calcium Carbonate-Vitamin D, CITRUS BERGAMOT PO, albuterol, Shingrix, and rosuvastatin.  No orders of the defined types were placed in this encounter.

## 2022-01-01 NOTE — Progress Notes (Signed)
Subjective:   By signing my name below, I, Anna Peterson, attest that this documentation has been prepared under the direction and in the presence of [place], NP 01/01/2022.    Patient ID: Anna Peterson, female    DOB: 1950/06/21, 71 y.o.   MRN: [id number]  Chief Complaint  Patient presents with   Follow-up    HPI Patient is in today for office visit.   Neuropathy: She complains of pain and numbness on both of great toes that radiates to both her other toes since 12/29/2021. She thinks is caused by 10 mg Rosuvastatin so she stopped taking it on 12/29/2021. She also developed pain on her calves. She denies any lower back pain. She sees a chiropractor regularly and is requesting to see them before any other treatment. She is requesting blood work to see if her Crestor has affected her outside of her cholesterol levels.  Lab Results  Component Value Date   CHOL 267 (H) 10/15/2021   HDL 64.50 10/15/2021   LDLCALC 186 (H) 10/15/2021   TRIG 81.0 10/15/2021   CHOLHDL 4 10/15/2021    Immunization: She is not interested in receiving the influenza vaccine during today's visit.    Past Medical History:  Diagnosis Date   Allergy    Asthma    History of chicken pox    Hyperlipidemia    Migraine    OSA (obstructive sleep apnea) 11/10/2017   Severe per home study 7/19   Osteopenia    Osteopenia per Dexa Scan    Past Surgical History:  Procedure Laterality Date   CESAREAN SECTION     EYE SURGERY     cataract surgery, vitreous removal 2022   KNEE SURGERY Left 2007   meniscus repair    Family History  Problem Relation Age of Onset   Aneurysm Mother 56       Cerebral age 10   CAD Brother 43   Pancreatic cancer Maternal Grandmother    Arthritis Maternal Grandmother    Stroke Maternal Grandfather 70   Arthritis Maternal Grandfather    Breast cancer Paternal Grandmother 56   Arthritis Paternal Grandmother    Arthritis Paternal Grandfather    CAD Brother 20     Social History   Socioeconomic History   Marital status: Married    Spouse name: Not on file   Number of children: Not on file   Years of education: Not on file   Highest education level: Not on file  Occupational History   Not on file  Tobacco Use   Smoking status: Former    Types: Cigarettes    Quit date: 12/06/1970    Years since quitting: 51.1   Smokeless tobacco: Never  Vaping Use   Vaping Use: Never used  Substance and Sexual Activity   Alcohol use: Yes    Alcohol/week: 5.0 standard drinks of alcohol    Types: 5 Glasses of wine per week   Drug use: No   Sexual activity: Not on file  Other Topics Concern   Not on file  Social History Narrative   Retired Marine scientist, 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  Live in 4 different states.  Oldest son Anna Peterson lives locally. Second son Anna Peterson- lives in [place], daughter lives in [place], [place]   No pets++   Married for 43 years.     Social Determinants of Health   Financial Resource Strain: Not on file  Food Insecurity: Not on file  Transportation Needs: Not on file  Physical Activity: Not on file  Stress: Not on file  Social Connections: Not on file  Intimate Partner Violence: Not on file    Outpatient Medications Prior to Visit  Medication Sig Dispense Refill   albuterol (VENTOLIN HFA) 108 (90 Base) MCG/ACT inhaler INHALE 2 PUFFS BY MOUTH EVERY 6 HOURS AS NEEDED FOR WHEEZE 8.5 each 2   Calcium Carbonate-Vitamin D 600-400 MG-UNIT chew tablet Chew 1 tablet by mouth daily.     CITRUS BERGAMOT PO Take by mouth.     fluticasone (FLONASE) 50 MCG/ACT nasal spray Place 1 spray into both nostrils daily as needed for allergies or rhinitis.     loratadine (CLARITIN) 10 MG tablet Take 10 mg by mouth daily as needed for allergies.     Multiple  Vitamins-Minerals (MULTIVITAL PO) Take 1 tablet by mouth daily. MNS3. (metabolic nutrition system)     Omega-3 Fatty Acids (FISH OIL) 1200 MG CAPS Take 1,200 mg by mouth 2 (two) times daily.     rosuvastatin (CRESTOR) 10 MG tablet Take 1 tablet (10 mg total) by mouth daily. 90 tablet 1   SUMAtriptan (IMITREX) 100 MG tablet Take 1 tablet (100 mg total) by mouth every 2 (two) hours as needed for migraine. May repeat in 2 hours if headache persists or recurs. 10 tablet 1   Zoster Vaccine Adjuvanted [facility]) injection Inject 0.14m IM now and again in 2-6  months 0.5 mL 1   No facility-administered medications prior to visit.    Allergies  Allergen Reactions   Cabbage     "Migraine"   Other Swelling    Peaches   Ceclor [Cefaclor] Rash   Latex Rash    Review of Systems  Musculoskeletal:        (+) tingling feeling on both of the big toes.        Objective:    Physical Exam Constitutional:      General: She is not in acute distress.    Appearance: Normal appearance. She is not ill-appearing.  HENT:     Head: Normocephalic and atraumatic.     Right Ear: External ear normal.     Left Ear: External ear normal.  Eyes:     Extraocular Movements: Extraocular movements intact.     Pupils: Pupils are equal, round, and reactive to light.  Cardiovascular:     Rate and Rhythm: Normal rate and regular rhythm.     Heart sounds: Normal heart sounds. No murmur heard.    No gallop.  Pulmonary:     Effort: Pulmonary effort is normal. No respiratory distress.     Breath sounds: Normal breath sounds. No wheezing or rales.  Skin:    General: Skin is warm and dry.  Neurological:     Mental Status: She is alert and oriented to person, place, and time.  Psychiatric:        Judgment: Judgment normal.     BP (!) 148/76   Pulse 68   Resp 18   Ht '5\' 2"'$  (1.575 m)   Wt 160 lb (72.6 kg)   LMP 04/07/1997   SpO2 99%   BMI 29.26 kg/m  Wt Readings from Last 3 Encounters:  01/01/22 160 lb  (72.6 kg)  10/15/21 157 lb 3.2 oz (71.3 kg)  05/29/21 156 lb 3.2 oz (70.9 kg)       Assessment & Plan:   Problem List Items Addressed This Visit       Unprioritized   Numbness of toes    New. Given that she has pain in both great toes and pain in both calfs, ? lumbarsacral radiculopathy. Offered a trial of NSAID, however she prefers not to take medication and instead to follow up with her chiropractor for adjustment. She will let me know if symptoms are not improved in 1 month.       Hyperlipidemia - Primary    Encouraged her to restart statin. She would like to check lipid panel first.       Relevant Orders   Hepatic function panel   Lipid panel      No orders of the defined types were placed in this encounter.   I, Nance Pear, NP, personally preformed the services described in this documentation.  All medical record entries made by the scribe were at my direction and in my presence.  I have reviewed the chart and discharge instructions (if applicable) and agree that the record reflects my personal performance and is accurate and complete. 01/01/2022  I,Melissa S [name],acting as a scribe for Nance Pear, NP.,have documented all relevant documentation on the behalf of Nance Pear, NP,as directed by  Nance Pear, NP while in the presence of Nance Pear, NP.    Nance Pear, NP

## 2022-01-01 NOTE — Assessment & Plan Note (Signed)
New. Given that she has pain in both great toes and pain in both calfs, ? lumbarsacral radiculopathy. Offered a trial of NSAID, however she prefers not to take medication and instead to follow up with her chiropractor for adjustment. She will let me know if symptoms are not improved in 1 month.

## 2022-01-02 ENCOUNTER — Encounter: Payer: Self-pay | Admitting: Family

## 2022-01-03 ENCOUNTER — Ambulatory Visit (INDEPENDENT_AMBULATORY_CARE_PROVIDER_SITE_OTHER): Payer: Medicare Other | Admitting: *Deleted

## 2022-01-03 DIAGNOSIS — Z0000 Encounter for general adult medical examination without abnormal findings: Secondary | ICD-10-CM

## 2022-01-03 NOTE — Progress Notes (Signed)
Subjective:   Anna Peterson is a 71 y.o. female who presents for Medicare Annual (Subsequent) preventive examination.  I connected with  [name] on 01/03/22 by a audio enabled telemedicine application and verified that I am speaking with the correct person using two identifiers.  Patient Location: Home  Provider Location: Office/Clinic  I discussed the limitations of evaluation and management by telemedicine. The patient expressed understanding and agreed to proceed.   Review of Systems    Defer to PCP Cardiac Risk Factors include: advanced age (>65mn, >>108women);dyslipidemia  Objective:    There were no vitals filed for this visit. There is no height or weight on file to calculate BMI.     01/03/2022    1:02 PM 12/21/2017   11:05 PM  Advanced Directives  Does Patient Have a Medical Advance Directive? Yes Yes  Type of AParamedicof AGamalielLiving will HKimberlyLiving will  Does patient want to make changes to medical advance directive? No - Patient declined   Copy of [place] Chart? No - copy requested No - copy requested    Current Medications (verified) Outpatient Encounter Medications as of 01/03/2022  Medication Sig   albuterol (VENTOLIN HFA) 108 (90 Base) MCG/ACT inhaler INHALE 2 PUFFS BY MOUTH EVERY 6 HOURS AS NEEDED FOR WHEEZE   Calcium Carbonate-Vitamin D 600-400 MG-UNIT chew tablet Chew 1 tablet by mouth daily.   CITRUS BERGAMOT PO Take by mouth.   fluticasone (FLONASE) 50 MCG/ACT nasal spray Place 1 spray into both nostrils daily as needed for allergies or rhinitis.   loratadine (CLARITIN) 10 MG tablet Take 10 mg by mouth daily as needed for allergies.   Multiple Vitamins-Minerals (MULTIVITAL PO) Take 1 tablet by mouth daily. MNS3. (metabolic nutrition system)   Omega-3 Fatty Acids (FISH OIL) 1200 MG CAPS Take 1,200 mg by mouth 2 (two) times daily.   rosuvastatin (CRESTOR) 10 MG tablet Take  1 tablet (10 mg total) by mouth daily.   SUMAtriptan (IMITREX) 100 MG tablet Take 1 tablet (100 mg total) by mouth every 2 (two) hours as needed for migraine. May repeat in 2 hours if headache persists or recurs.   Zoster Vaccine Adjuvanted ([facility] injection Inject 0.540mIM now and again in 2-6  months   No facility-administered encounter medications on file as of 01/03/2022.    Allergies (verified) Cabbage, Other, Ceclor [cefaclor], and Latex   History: Past Medical History:  Diagnosis Date   Allergy    Asthma    History of chicken pox    Hyperlipidemia    Migraine    OSA (obstructive sleep apnea) 11/10/2017   Severe per home study 7/19   Osteopenia    Osteopenia per Dexa Scan   Past Surgical History:  Procedure Laterality Date   CESAREAN SECTION     EYE SURGERY     cataract surgery, vitreous removal 2022   KNEE SURGERY Left 2007   meniscus repair   Family History  Problem Relation Age of Onset   Aneurysm Mother [id number]     Cerebral age 70 CAD Brother [id number] Pancreatic cancer Maternal Grandmother    Arthritis Maternal Grandmother    Stroke Maternal Grandfather [phone] Arthritis Maternal Grandfather    Breast cancer Paternal Grandmother [phone] Arthritis Paternal Grandmother    Arthritis Paternal Grandfather    CAD Brother 5855 Social History   Socioeconomic History   Marital status:  Married    Spouse name: Not on file   Number of children: Not on file   Years of education: Not on file   Highest education level: Not on file  Occupational History   Not on file  Tobacco Use   Smoking status: Former    Types: Cigarettes    Quit date: 12/06/1970    Years since quitting: 51.1   Smokeless tobacco: Never  Vaping Use   Vaping Use: Never used  Substance and Sexual Activity   Alcohol use: Yes    Alcohol/week: 5.0 standard drinks of alcohol    Types: 5 Glasses of wine per week   Drug use: No   Sexual activity: Not on file  Other Topics Concern   Not on file  Social  History Narrative   Retired Marine scientist, 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  Live in 4 different states.  Oldest son Corene Cornea lives locally. Second son Shanon Brow- lives in [place], daughter lives in [place], [place] lives on [place]   No pets++   Married for 43 years.     Social Determinants of Health   Financial Resource Strain: Low Risk  (01/03/2022)   Overall Financial Resource Strain (CARDIA)    Difficulty of Paying Living Expenses: Not hard at all  Food Insecurity: No Food Insecurity (01/03/2022)   Hunger Vital Sign    Worried About Running Out of Food in the Last Year: Never true    Ran Out of Food in the Last Year: Never true  Transportation Needs: No Transportation Needs (01/03/2022)   PRAPARE - Hydrologist (Medical): No    Lack of Transportation (Non-Medical): No  Physical Activity: Inactive (01/03/2022)   Exercise Vital Sign    Days of Exercise per Week: 0 days    Minutes of Exercise per Session: 0 min  Stress: No Stress Concern Present (01/03/2022)   [place]    Feeling of Stress : Not at all  Social Connections: [place] (01/03/2022)   Social Connection and Isolation Panel [NHANES]    Frequency of Communication with Friends and Family: More than three times a week    Frequency of Social Gatherings with Friends and Family: More than three times a week    Attends Religious Services: More than 4 times per year    Active Member of [employer] or Organizations: Yes    Attends Music therapist: More than 4 times per year    Marital Status: Married    Tobacco Counseling Counseling given: Not Answered   Clinical Intake:  Pre-visit preparation completed: Yes  Pain : No/denies pain     Diabetes: No  How  often do you need to have someone help you when you read instructions, pamphlets, or other written materials from your doctor or pharmacy?: 1 - Never  Diabetic? No  Activities of Daily Living    01/03/2022    1:07 PM  In your present state of health, do you have any difficulty performing the following activities:  Hearing? 0  Vision? 0  Difficulty concentrating or making decisions? 0  Walking or climbing stairs? 0  Dressing or bathing? 0  Doing errands, shopping? 0  Preparing Food and eating ? N  Using the Toilet?  N  In the past six months, have you accidently leaked urine? N  Do you have problems with loss of bowel control? N  Managing your Medications? N  Managing your Finances? N  Housekeeping or managing your Housekeeping? N    Patient Care Team: [name], NP as PCP - General (Internal Medicine) Lake Bells., MD as Referring Physician (Gastroenterology) Mammography, [facility] (Diagnostic Radiology)  Indicate any recent Medical Services you may have received from other than [facility] providers in the past year (date may be approximate).     Assessment:   This is a routine wellness examination for Khalis.  Hearing/Vision screen No results found.  Dietary issues and exercise activities discussed: Current Exercise Habits: Home exercise routine, Type of exercise: walking, Time (Minutes): 30, Frequency (Times/Week): 3, Weekly Exercise (Minutes/Week): 90, Intensity: Mild, Exercise limited by: None identified   Goals Addressed   None    Depression Screen    01/03/2022    1:06 PM 10/15/2021    9:31 AM 10/12/2020    9:14 AM 10/12/2020    9:09 AM 10/03/2019    8:34 AM 06/26/2017    8:41 AM  PHQ 2/9 Scores  PHQ - 2 Score 0 0 0 0 0 0  PHQ- 9 Score      2    Fall Risk    01/03/2022    1:02 PM 10/15/2021    9:31 AM 10/12/2020    9:14 AM 10/12/2020    9:09 AM 10/03/2019    8:33 AM  Fall Risk   Falls in the past year? 0 0 0 0 0  Number falls in past yr: 0 0 0 0 0  Injury with  Fall? 0 0 0 0 0  Risk for fall due to : No Fall Risks      Follow up Falls evaluation completed Falls evaluation completed       [place]:  Any stairs in or around the home? Yes  If so, are there any without handrails? No  Home free of loose throw rugs in walkways, pet beds, electrical cords, etc? Yes  Adequate lighting in your home to reduce risk of falls? Yes   ASSISTIVE DEVICES UTILIZED TO PREVENT FALLS:  Life alert? No  Use of a cane, walker or w/c? No  Grab bars in the bathroom? No  Shower chair or bench in shower? Yes  Elevated toilet seat or a handicapped toilet? No   TIMED UP AND GO:  Was the test performed? No . Audio visit   Cognitive Function:        01/03/2022    1:17 PM  6CIT Screen  What Year? 0 points  What month? 0 points  What time? 0 points  Count back from 20 0 points  Months in reverse 0 points  Repeat phrase 0 points  Total Score 0 points    Immunizations Immunization History  Administered Date(s) Administered   PFIZER(Purple Top)SARS-COV-2 Vaccination 05/05/2019, 06/02/2019, 01/10/2020   PNEUMOCOCCAL CONJUGATE-20 11/01/2021   Pneumococcal Polysaccharide-23 10/01/2018   Tdap 05/30/2009, 12/13/2019    TDAP status: Up to date  Flu Vaccine status: Declined, Education has been provided regarding the importance of this vaccine but patient still declined. Advised may receive this vaccine at local pharmacy or Health Dept. Aware to provide a copy of the vaccination record if obtained from local pharmacy or Health Dept. Verbalized acceptance and understanding.  Pneumococcal vaccine status: Up to date  Covid-19 vaccine status: Information provided  on how to obtain vaccines.   Qualifies for Shingles Vaccine? Yes   Zostavax completed No   Shingrix Completed?: No.    Education has been provided regarding the importance of this vaccine. Patient has been advised to call insurance company to determine out of pocket  expense if they have not yet received this vaccine. Advised may also receive vaccine at local pharmacy or Health Dept. Verbalized acceptance and understanding.  Screening Tests Health Maintenance  Topic Date Due   Zoster Vaccines- Shingrix (1 of 2) Never done   COVID-19 Vaccine (4 - Pfizer series) 03/06/2020   INFLUENZA VACCINE  07/06/2022 (Originally 11/05/2021)   MAMMOGRAM  11/13/2023   COLONOSCOPY (Pts 45-65yr Insurance coverage will need to be confirmed)  01/20/2027   TETANUS/TDAP  12/12/2029   Pneumonia Vaccine 70 Years old  Completed   DEXA SCAN  Completed   Hepatitis C Screening  Completed   HPV VACCINES  Aged Out    Health Maintenance  Health Maintenance Due  Topic Date Due   Zoster Vaccines- Shingrix (1 of 2) Never done   COVID-19 Vaccine (4 - Pfizer series) 03/06/2020    Colorectal cancer screening: Type of screening: Cologuard. Completed 01/19/17. Repeat every 10 years  Mammogram status: Completed 11/12/21. Repeat every year  Bone Density status: Completed 11/12/20. Results reflect: Bone density results: OSTEOPENIA. Repeat every 2 years.  Lung Cancer Screening: (Low Dose CT Chest recommended if Age 70-80years, 30 pack-year currently smoking OR have quit w/in 15years.) does not qualify.   Lung Cancer Screening Referral: N/a  Additional Screening:  Hepatitis C Screening: does qualify; Completed 10/01/18  Vision Screening: Recommended annual ophthalmology exams for early detection of glaucoma and other disorders of the eye. Is the patient up to date with their annual eye exam?  Yes  Who is the provider or what is the name of the office in which the patient attends annual eye exams? Dr. HTraci Sermon and Dr. RAntionette FairyIf pt is not established with a provider, would they like to be referred to a provider to establish care? No .   Dental Screening: Recommended annual dental exams for proper oral hygiene  Community Resource Referral / Chronic Care Management: CRR  required this visit?  No   CCM required this visit?  No      Plan:     I have personally reviewed and noted the following in the patient's chart:   Medical and social history Use of alcohol, tobacco or illicit drugs  Current medications and supplements including opioid prescriptions. Patient is not currently taking opioid prescriptions. Functional ability and status Nutritional status Physical activity Advanced directives List of other physicians Hospitalizations, surgeries, and ER visits in previous 12 months Vitals Screenings to include cognitive, depression, and falls Referrals and appointments  In addition, I have reviewed and discussed with patient certain preventive protocols, quality metrics, and best practice recommendations. A written personalized care plan for preventive services as well as general preventive health recommendations were provided to patient.   Due to this being a telephonic visit, the after visit summary with patients personalized plan was offered to patient via mail or my-chart.  Patient would like to access on my-chart.  BBeatris Ship [place]  01/03/2022   Nurse Notes: None

## 2022-01-03 NOTE — Patient Instructions (Signed)
Anna Peterson , Thank you for taking time to come for your Medicare Wellness Visit. I appreciate your ongoing commitment to your health goals. Please review the following plan we discussed and let me know if I can assist you in the future.   These are the goals we discussed:  Goals   None     This is a list of the screening recommended for you and due dates:  Health Maintenance  Topic Date Due   Zoster (Shingles) Vaccine (1 of 2) Never done   COVID-19 Vaccine (4 - Pfizer series) 03/06/2020   Flu Shot  07/06/2022*   Mammogram  11/13/2023   Colon Cancer Screening  01/20/2027   Tetanus Vaccine  12/12/2029   Pneumonia Vaccine  Completed   DEXA scan (bone density measurement)  Completed   Hepatitis C Screening: USPSTF Recommendation to screen - Ages 40-79 yo.  Completed   HPV Vaccine  Aged Out  *Topic was postponed. The date shown is not the original due date.     Next appointment: Follow up in one year for your annual wellness visit    Preventive Care 65 Years and Older, Female Preventive care refers to lifestyle choices and visits with your health care provider that can promote health and wellness. What does preventive care include? A yearly physical exam. This is also called an annual well check. Dental exams once or twice a year. Routine eye exams. Ask your health care provider how often you should have your eyes checked. Personal lifestyle choices, including: Daily care of your teeth and gums. Regular physical activity. Eating a healthy diet. Avoiding tobacco and drug use. Limiting alcohol use. Practicing safe sex. Taking low-dose aspirin every day. Taking vitamin and mineral supplements as recommended by your health care provider. What happens during an annual well check? The services and screenings done by your health care provider during your annual well check will depend on your age, overall health, lifestyle risk factors, and family history of disease. Counseling   Your health care provider may ask you questions about your: Alcohol use. Tobacco use. Drug use. Emotional well-being. Home and relationship well-being. Sexual activity. Eating habits. History of falls. Memory and ability to understand (cognition). Work and work Statistician. Reproductive health. Screening  You may have the following tests or measurements: Height, weight, and BMI. Blood pressure. Lipid and cholesterol levels. These may be checked every 5 years, or more frequently if you are over 67 years old. Skin check. Lung cancer screening. You may have this screening every year starting at age 70 if you have a 30-pack-year history of smoking and currently smoke or have quit within the past 15 years. Fecal occult blood test (FOBT) of the stool. You may have this test every year starting at age 70. Flexible sigmoidoscopy or colonoscopy. You may have a sigmoidoscopy every 5 years or a colonoscopy every 10 years starting at age 70. Hepatitis C blood test. Hepatitis B blood test. Sexually transmitted disease (STD) testing. Diabetes screening. This is done by checking your blood sugar (glucose) after you have not eaten for a while (fasting). You may have this done every 1-3 years. Bone density scan. This is done to screen for osteoporosis. You may have this done starting at age 70. Mammogram. This may be done every 1-2 years. Talk to your health care provider about how often you should have regular mammograms. Talk with your health care provider about your test results, treatment options, and if necessary, the need for more tests.  Vaccines  Your health care provider may recommend certain vaccines, such as: Influenza vaccine. This is recommended every year. Tetanus, diphtheria, and acellular pertussis (Tdap, Td) vaccine. You may need a Td booster every 10 years. Zoster vaccine. You may need this after age 4. Pneumococcal 13-valent conjugate (PCV13) vaccine. One dose is recommended  after age 15. Pneumococcal polysaccharide (PPSV23) vaccine. One dose is recommended after age 70. Talk to your health care provider about which screenings and vaccines you need and how often you need them. This information is not intended to replace advice given to you by your health care provider. Make sure you discuss any questions you have with your health care provider. Document Released: 04/20/2015 Document Revised: 12/12/2015 Document Reviewed: 01/23/2015 Elsevier Interactive Patient Education  2017 [place] Prevention in the Home Falls can cause injuries. They can happen to people of all ages. There are many things you can do to make your home safe and to help prevent falls. What can I do on the outside of my home? Regularly fix the edges of walkways and driveways and fix any cracks. Remove anything that might make you trip as you walk through a door, such as a raised step or threshold. Trim any bushes or trees on the path to your home. Use bright outdoor lighting. Clear any walking paths of anything that might make someone trip, such as rocks or tools. Regularly check to see if handrails are loose or broken. Make sure that both sides of any steps have handrails. Any raised decks and porches should have guardrails on the edges. Have any leaves, snow, or ice cleared regularly. Use sand or salt on walking paths during winter. Clean up any spills in your garage right away. This includes oil or grease spills. What can I do in the bathroom? Use night lights. Install grab bars by the toilet and in the tub and shower. Do not use towel bars as grab bars. Use non-skid mats or decals in the tub or shower. If you need to sit down in the shower, use a plastic, non-slip stool. Keep the floor dry. Clean up any water that spills on the floor as soon as it happens. Remove soap buildup in the tub or shower regularly. Attach bath mats securely with double-sided non-slip rug tape. Do not  have throw rugs and other things on the floor that can make you trip. What can I do in the bedroom? Use night lights. Make sure that you have a light by your bed that is easy to reach. Do not use any sheets or blankets that are too big for your bed. They should not hang down onto the floor. Have a firm chair that has side arms. You can use this for support while you get dressed. Do not have throw rugs and other things on the floor that can make you trip. What can I do in the kitchen? Clean up any spills right away. Avoid walking on wet floors. Keep items that you use a lot in easy-to-reach places. If you need to reach something above you, use a strong step stool that has a grab bar. Keep electrical cords out of the way. Do not use floor polish or wax that makes floors slippery. If you must use wax, use non-skid floor wax. Do not have throw rugs and other things on the floor that can make you trip. What can I do with my stairs? Do not leave any items on the stairs. Make sure that  there are handrails on both sides of the stairs and use them. Fix handrails that are broken or loose. Make sure that handrails are as long as the stairways. Check any carpeting to make sure that it is firmly attached to the stairs. Fix any carpet that is loose or worn. Avoid having throw rugs at the top or bottom of the stairs. If you do have throw rugs, attach them to the floor with carpet tape. Make sure that you have a light switch at the top of the stairs and the bottom of the stairs. If you do not have them, ask someone to add them for you. What else can I do to help prevent falls? Wear shoes that: Do not have high heels. Have rubber bottoms. Are comfortable and fit you well. Are closed at the toe. Do not wear sandals. If you use a stepladder: Make sure that it is fully opened. Do not climb a closed stepladder. Make sure that both sides of the stepladder are locked into place. Ask someone to hold it for  you, if possible. Clearly mark and make sure that you can see: Any grab bars or handrails. First and last steps. Where the edge of each step is. Use tools that help you move around (mobility aids) if they are needed. These include: Canes. Walkers. Scooters. Crutches. Turn on the lights when you go into a dark area. Replace any light bulbs as soon as they burn out. Set up your furniture so you have a clear path. Avoid moving your furniture around. If any of your floors are uneven, fix them. If there are any pets around you, be aware of where they are. Review your medicines with your doctor. Some medicines can make you feel dizzy. This can increase your chance of falling. Ask your doctor what other things that you can do to help prevent falls. This information is not intended to replace advice given to you by your health care provider. Make sure you discuss any questions you have with your health care provider. Document Released: 01/18/2009 Document Revised: 08/30/2015 Document Reviewed: 04/28/2014 Elsevier Interactive Patient Education  2017 [employer].

## 2022-01-08 ENCOUNTER — Institutional Professional Consult (permissible substitution): Payer: Medicare Other | Admitting: Pulmonary Disease

## 2022-01-14 DIAGNOSIS — M62830 Muscle spasm of back: Secondary | ICD-10-CM | POA: Diagnosis not present

## 2022-01-14 DIAGNOSIS — M9903 Segmental and somatic dysfunction of lumbar region: Secondary | ICD-10-CM | POA: Diagnosis not present

## 2022-01-14 DIAGNOSIS — M9904 Segmental and somatic dysfunction of sacral region: Secondary | ICD-10-CM | POA: Diagnosis not present

## 2022-01-14 DIAGNOSIS — M9905 Segmental and somatic dysfunction of pelvic region: Secondary | ICD-10-CM | POA: Diagnosis not present

## 2022-01-17 ENCOUNTER — Encounter: Payer: Self-pay | Admitting: Pulmonary Disease

## 2022-01-17 ENCOUNTER — Ambulatory Visit (INDEPENDENT_AMBULATORY_CARE_PROVIDER_SITE_OTHER): Payer: Medicare Other | Admitting: Pulmonary Disease

## 2022-01-17 VITALS — BP 124/80 | HR 69 | Ht 62.0 in | Wt 160.6 lb

## 2022-01-17 DIAGNOSIS — G4733 Obstructive sleep apnea (adult) (pediatric): Secondary | ICD-10-CM

## 2022-01-17 NOTE — Patient Instructions (Signed)
Continue using your CPAP nightly  I will see you a year from now  Call us with significant concerns

## 2022-01-17 NOTE — Progress Notes (Signed)
Anna Peterson    [id number]    07-Apr-1951  Primary Care Physician:[name], [name], NP  Referring Physician: [name], NP [place] [address] [place],  [place] [address]  Chief complaint:   Follow-up for obstructive sleep apnea  HPI:  History of severe obstructive sleep apnea Last in the office about 3 years ago Continues to use CPAP nightly  No longer has migraine headaches Feels she sleeps okay with the CPAP  Occasionally has mask pressures and occasional rainouts  Otherwise she is doing well  Health has been relatively stable  She does try to stay active on a regular basis    Outpatient Encounter Medications as of 01/17/2022  Medication Sig   albuterol (VENTOLIN HFA) 108 (90 Base) MCG/ACT inhaler INHALE 2 PUFFS BY MOUTH EVERY 6 HOURS AS NEEDED FOR WHEEZE   Calcium Carbonate-Vitamin D 600-400 MG-UNIT chew tablet Chew 1 tablet by mouth daily.   CITRUS BERGAMOT PO Take by mouth.   fluticasone (FLONASE) 50 MCG/ACT nasal spray Place 1 spray into both nostrils daily as needed for allergies or rhinitis.   loratadine (CLARITIN) 10 MG tablet Take 10 mg by mouth daily as needed for allergies.   Multiple Vitamins-Minerals (MULTIVITAL PO) Take 1 tablet by mouth daily. MNS3. (metabolic nutrition system)   Omega-3 Fatty Acids (FISH OIL) 1200 MG CAPS Take 1,200 mg by mouth 2 (two) times daily.   SUMAtriptan (IMITREX) 100 MG tablet Take 1 tablet (100 mg total) by mouth every 2 (two) hours as needed for migraine. May repeat in 2 hours if headache persists or recurs.   Zoster Vaccine Adjuvanted [facility]) injection Inject 0.51m IM now and again in 2-6  months   [DISCONTINUED] rosuvastatin (CRESTOR) 10 MG tablet Take 1 tablet (10 mg total) by mouth daily.   No facility-administered encounter medications on file as of 01/17/2022.    Allergies as of 01/17/2022 - Review Complete 01/17/2022  Allergen Reaction Noted   Cabbage  12/29/2016   Other Swelling  10/03/2016   Ceclor [cefaclor] Rash 12/20/2013   Latex Rash 12/26/2013    Past Medical History:  Diagnosis Date   Allergy    Asthma    History of chicken pox    Hyperlipidemia    Migraine    OSA (obstructive sleep apnea) 11/10/2017   Severe per home study 7/19   Osteopenia    Osteopenia per Dexa Scan    Past Surgical History:  Procedure Laterality Date   CESAREAN SECTION     EYE SURGERY     cataract surgery, vitreous removal 2022   KNEE SURGERY Left 2007   meniscus repair    Family History  Problem Relation Age of Onset   Aneurysm Mother 433      Cerebral age 397  CAD Brother [phone]  Pancreatic cancer Maternal Grandmother    Arthritis Maternal Grandmother    Stroke Maternal Grandfather [phone]  Arthritis Maternal Grandfather    Breast cancer Paternal Grandmother 777  Arthritis Paternal Grandmother    Arthritis Paternal Grandfather    CAD Brother 586   Social History   Socioeconomic History   Marital status: Married    Spouse name: Not on file   Number of children: Not on file   Years of education: Not on file   Highest education level: Not on file  Occupational History   Not on file  Tobacco Use   Smoking status: Former  Types: Cigarettes    Quit date: 12/06/1970    Years since quitting: 51.1   Smokeless tobacco: Never  Vaping Use   Vaping Use: Never used  Substance and Sexual Activity   Alcohol use: Yes    Alcohol/week: 5.0 standard drinks of alcohol    Types: 5 Glasses of wine per week   Drug use: No   Sexual activity: Not on file  Other Topics Concern   Not on file  Social History Narrative   Retired Marine scientist, 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  Live in 4 different states.  Oldest son Corene Cornea lives locally. Second son Shanon Brow- lives in [place], daughter lives in [place], Ramireno lives on [address]   No pets++   Married for 43 years.     Social Determinants of Health   Financial Resource Strain: Low Risk  (01/03/2022)   Overall Financial Resource Strain (CARDIA)    Difficulty of Paying Living Expenses: Not hard at all  Food Insecurity: No Food Insecurity (01/03/2022)   Hunger Vital Sign    Worried About Running Out of Food in the Last Year: Never true    Ran Out of Food in the Last Year: Never true  Transportation Needs: No Transportation Needs (01/03/2022)   PRAPARE - Hydrologist (Medical): No    Lack of Transportation (Non-Medical): No  Physical Activity: Inactive (01/03/2022)   Exercise Vital Sign    Days of Exercise per Week: 0 days    Minutes of Exercise per Session: 0 min  Stress: No Stress Concern Present (01/03/2022)   [place]    Feeling of Stress : Not at all  Social Connections: [place] (01/03/2022)   Social Connection and Isolation Panel [NHANES]    Frequency of Communication with Friends and Family: More than three times a week    Frequency of Social Gatherings with Friends and Family: More than three times a week    Attends Religious Services: More than 4 times per year    Active Member of [employer] or Organizations: Yes    Attends Archivist Meetings: More than 4 times per year    Marital Status: Married  Human resources officer Violence: Not At Risk (01/03/2022)   Humiliation, Afraid, Rape, and Kick questionnaire    Fear of Current or Ex-Partner: No    Emotionally Abused: No    Physically Abused: No    Sexually Abused: No    Review of Systems  Constitutional:  Negative for fatigue.  Respiratory:  Positive for apnea. Negative for shortness of breath.   Psychiatric/Behavioral:  Positive for sleep disturbance.     There were no vitals filed for this visit.   Physical Exam Constitutional:      Appearance: Normal appearance.  HENT:     Head:  Normocephalic.  Cardiovascular:     Rate and Rhythm: Normal rate.     Heart sounds: No murmur heard.    No friction rub.  Pulmonary:     Effort: No respiratory distress.     Breath sounds: No stridor. No wheezing or rhonchi.  Musculoskeletal:     Cervical back: No rigidity or tenderness.  Neurological:     Mental Status: She is alert.  Psychiatric:  Mood and Affect: Mood normal.    Data Reviewed: Compliance data reviewed showing excellent compliance 100% in the last 30 days Average use of 7 hours 56 minutes Auto set of 10-20 95 percentile pressure of 16.3 Occasional mask leaks AHI of 5.5  Assessment:  Severe obstructive sleep apnea adequately treated with CPAP therapy  No significant daytime sleepiness  Feels she continues to benefit from CPAP use  Plan/Recommendations: Continue CPAP nightly  Follow-up in a year  Encouraged to call with significant concerns  Graded activities as tolerated   Sherrilyn Rist MD [facility] 01/17/2022, 10:03 AM  CC: [name], NP

## 2022-02-08 NOTE — Progress Notes (Unsigned)
Cardiology Office Note   Date:  02/10/2022   ID:  Anna Peterson, DOB 1950-12-20, MRN [id number]  PCP:  [name], NP  Cardiologist:   None   Chief Complaint  Patient presents with   Abnormal ECG     History of Present Illness: Anna Peterson is a 71 y.o. female who presents for evaluation of an abnormal EKG.  She has a family history of early coronary artery disease.  I saw her in 2021 she had a stress test which was indeterminant.  She had a calcium score of 0.  The cardiovascular symptoms were non anginal.    She does does not report any new cardiovascular symptoms.  She walks and goes up inclines with her husband.  She might get a little bit of shortness of breath walking up an incline but she says this is mild compared to what she thinks she is doing.  She is not having any resting shortness of breath, PND or orthopnea.  She is not having any palpitations, presyncope or syncope.  She had no new chest discomfort.   Past Medical History:  Diagnosis Date   Allergy    Asthma    History of chicken pox    Hyperlipidemia    Migraine    OSA (obstructive sleep apnea) 11/10/2017   Severe per home study 7/19   Osteopenia    Osteopenia per Dexa Scan    Past Surgical History:  Procedure Laterality Date   CESAREAN SECTION     EYE SURGERY     cataract surgery, vitreous removal 2022   KNEE SURGERY Left 2007   meniscus repair     Current Outpatient Medications  Medication Sig Dispense Refill   albuterol (VENTOLIN HFA) 108 (90 Base) MCG/ACT inhaler INHALE 2 PUFFS BY MOUTH EVERY 6 HOURS AS NEEDED FOR WHEEZE 8.5 each 2   Calcium Carbonate-Vitamin D 600-400 MG-UNIT chew tablet Chew 1 tablet by mouth daily.     CITRUS BERGAMOT PO Take by mouth.     fluticasone (FLONASE) 50 MCG/ACT nasal spray Place 1 spray into both nostrils daily as needed for allergies or rhinitis.     loratadine (CLARITIN) 10 MG tablet Take 10 mg by mouth daily as needed for allergies.     Multiple  Vitamins-Minerals (MULTIVITAL PO) Take 1 tablet by mouth daily. MNS3. (metabolic nutrition system)     Omega-3 Fatty Acids (FISH OIL) 1200 MG CAPS Take 1,200 mg by mouth 2 (two) times daily.     rosuvastatin (CRESTOR) 10 MG tablet Take 10 mg by mouth daily.     SUMAtriptan (IMITREX) 100 MG tablet Take 1 tablet (100 mg total) by mouth every 2 (two) hours as needed for migraine. May repeat in 2 hours if headache persists or recurs. 10 tablet 1   Zoster Vaccine Adjuvanted [facility]) injection Inject 0.80m IM now and again in 2-6  months 0.5 mL 1   No current facility-administered medications for this visit.    Allergies:   Cabbage, Other, Ceclor [cefaclor], and Latex    Social History:  The patient  reports that she quit smoking about 51 years ago. Her smoking use included cigarettes. She has never used smokeless tobacco. She reports current alcohol use of about 5.0 standard drinks of alcohol per week. She reports that she does not use drugs.   Family History:  The patient's family history includes Aneurysm (age of onset: 460 in her mother; Arthritis in her maternal grandfather, maternal grandmother, paternal grandfather, and paternal grandmother;  Breast cancer (age of onset: 60) in her paternal grandmother; CAD (age of onset: 48) in her brother; CAD (age of onset: 34) in her brother; Pancreatic cancer in her maternal grandmother; Stroke (age of onset: 26) in her maternal grandfather.    ROS:  Please see the history of present illness.   Otherwise, review of systems are positive for none.   All other systems are reviewed and negative.    PHYSICAL EXAM: VS:  BP 120/76 (BP Location: Left Arm, Patient Position: Sitting, Cuff Size: Normal)   Pulse 64   Ht '5\' 2"'$  (1.575 m)   Wt 161 lb (73 kg)   LMP 04/07/1997   BMI 29.45 kg/m  , BMI Body mass index is 29.45 kg/m. GENERAL:  Well appearing HEENT:  Pupils equal round and reactive, fundi not visualized, oral mucosa unremarkable NECK:  No jugular  venous distention, waveform within normal limits, carotid upstroke brisk and symmetric, no bruits, no thyromegaly LYMPHATICS:  No cervical, inguinal adenopathy LUNGS:  Clear to auscultation bilaterally BACK:  No CVA tenderness CHEST:  Unremarkable HEART:  PMI not displaced or sustained,S1 and S2 within normal limits, no S3, no S4, no clicks, no rubs, no murmurs ABD:  Flat, positive bowel sounds normal in frequency in pitch, no bruits, no rebound, no guarding, no midline pulsatile mass, no hepatomegaly, no splenomegaly EXT:  2 plus pulses throughout, no edema, no cyanosis no clubbing SKIN:  No rashes no nodules NEURO:  Cranial nerves II through XII grossly intact, motor grossly intact throughout PSYCH:  Cognitively intact, oriented to person place and time    EKG:  EKG is ordered today. The ekg ordered today demonstrates sinus rhythm, rate 64, low voltage in the limb and chest leads, RSR prime V1 and V2, left axis deviation.  Interestingly the P wave is inverted in lead I which would typically indicate abnormal lead placement but the axis and QRS complexes are exactly unchanged from previous when the P wave axis was normal.   Recent Labs: 10/15/2021: BUN 12; Creatinine, Ser 0.78; Potassium 4.0; Sodium 140 01/01/2022: ALT 26    Lipid Panel    Component Value Date/Time   CHOL 194 01/01/2022 0810   TRIG 92.0 01/01/2022 0810   HDL 62.20 01/01/2022 0810   CHOLHDL 3 01/01/2022 0810   VLDL 18.4 01/01/2022 0810   LDLCALC 114 (H) 01/01/2022 0810      Wt Readings from Last 3 Encounters:  02/10/22 161 lb (73 kg)  01/17/22 160 lb 9.6 oz (72.8 kg)  01/01/22 160 lb (72.6 kg)      Other studies Reviewed: Additional studies/ records that were reviewed today include: Labs. Review of the above records demonstrates:  Please see elsewhere in the note.     ASSESSMENT AND PLAN:  Sleep apnea: She is now being treated with CPAP and has much improvement in headaches and feels more  rested.  Dyslipidemia: She was just started on a statin given her family history.  I defer management to [name], NP  Abnormal EKG: I reviewed the POET (Plain Old Exercise Treadmill).  She has absolutely no symptoms despite exercise other than a little mild shortness of breath climbing up a hill which she thinks is totally appropriate for which she is doing.  She had an indeterminant stress test previously which did not meet criteria for ischemia and that was 2 years ago.  She has had no events since then.  I think she needs primary risk reduction.  I would like to  check an echocardiogram given the low voltage and abnormal P wave axis.   Current medicines are reviewed at length with the patient today.  The patient does not have concerns regarding medicines.  The following changes have been made:  no change  Labs/ tests ordered today include:   Orders Placed This Encounter  Procedures   EKG 12-Lead   ECHOCARDIOGRAM COMPLETE     Disposition:   FU with me in 1 year   Signed, Minus Breeding, MD  02/10/2022 9:30 AM    [place]

## 2022-02-10 ENCOUNTER — Ambulatory Visit: Payer: Medicare Other | Attending: Cardiology | Admitting: Cardiology

## 2022-02-10 ENCOUNTER — Encounter: Payer: Self-pay | Admitting: Cardiology

## 2022-02-10 VITALS — BP 120/76 | HR 64 | Ht 62.0 in | Wt 161.0 lb

## 2022-02-10 DIAGNOSIS — R9431 Abnormal electrocardiogram [ECG] [EKG]: Secondary | ICD-10-CM

## 2022-02-10 DIAGNOSIS — E782 Mixed hyperlipidemia: Secondary | ICD-10-CM | POA: Diagnosis not present

## 2022-02-10 DIAGNOSIS — G4730 Sleep apnea, unspecified: Secondary | ICD-10-CM

## 2022-02-10 NOTE — Patient Instructions (Signed)
Medication Instructions:  The current medical regimen is effective;  continue present plan and medications.  *If you need a refill on your cardiac medications before your next appointment, please call your pharmacy*   Testing/Procedures: Echocardiogram - Your physician has requested that you have an echocardiogram. Echocardiography is a painless test that uses sound waves to create images of your heart. It provides your doctor with information about the size and shape of your heart and how well your heart's chambers and valves are working. This procedure takes approximately one hour. There are no restrictions for this procedure.     Follow-Up: At [facility], you and your health needs are our priority.  As part of our continuing mission to provide you with exceptional heart care, we have created designated Provider Care Teams.  These Care Teams include your primary Cardiologist (physician) and Advanced Practice Providers (APPs -  Physician Assistants and Nurse Practitioners) who all work together to provide you with the care you need, when you need it.  We recommend signing up for the patient portal called "MyChart".  Sign up information is provided on this After Visit Summary.  MyChart is used to connect with patients for Virtual Visits (Telemedicine).  Patients are able to view lab/test results, encounter notes, upcoming appointments, etc.  Non-urgent messages can be sent to your provider as well.   To learn more about what you can do with MyChart, go to [email or web address].    Your next appointment:   12 month(s)  The format for your next appointment:   In Person  Provider:   Minus Breeding, MD

## 2022-02-11 DIAGNOSIS — M9903 Segmental and somatic dysfunction of lumbar region: Secondary | ICD-10-CM | POA: Diagnosis not present

## 2022-02-11 DIAGNOSIS — M9904 Segmental and somatic dysfunction of sacral region: Secondary | ICD-10-CM | POA: Diagnosis not present

## 2022-02-11 DIAGNOSIS — M62830 Muscle spasm of back: Secondary | ICD-10-CM | POA: Diagnosis not present

## 2022-02-11 DIAGNOSIS — G4733 Obstructive sleep apnea (adult) (pediatric): Secondary | ICD-10-CM | POA: Diagnosis not present

## 2022-02-11 DIAGNOSIS — M9905 Segmental and somatic dysfunction of pelvic region: Secondary | ICD-10-CM | POA: Diagnosis not present

## 2022-02-21 ENCOUNTER — Encounter: Payer: Self-pay | Admitting: Emergency Medicine

## 2022-02-21 ENCOUNTER — Ambulatory Visit
Admission: EM | Admit: 2022-02-21 | Discharge: 2022-02-21 | Disposition: A | Discharge status: Home / Self Care | Payer: Medicare Other | Attending: Family Medicine | Admitting: Family Medicine

## 2022-02-21 DIAGNOSIS — J3489 Other specified disorders of nose and nasal sinuses: Secondary | ICD-10-CM

## 2022-02-21 DIAGNOSIS — J0100 Acute maxillary sinusitis, unspecified: Secondary | ICD-10-CM

## 2022-02-21 DIAGNOSIS — J309 Allergic rhinitis, unspecified: Secondary | ICD-10-CM

## 2022-02-21 MED ORDER — AZITHROMYCIN 250 MG PO TABS: 250.0000 mg | ORAL_TABLET | Freq: Every day | ORAL | 0 refills | Status: DC

## 2022-02-21 MED ORDER — FEXOFENADINE HCL 180 MG PO TABS: 180.0000 mg | ORAL_TABLET | Freq: Every day | ORAL | 0 refills | Status: DC

## 2022-02-21 MED ORDER — PREDNISONE 20 MG PO TABS: ORAL_TABLET | ORAL | 0 refills | Status: DC

## 2022-02-21 NOTE — ED Triage Notes (Signed)
Pt c/o headache, facial pain and pressure for 2 days. She has been taking tylenol and motrin.

## 2022-02-21 NOTE — ED Provider Notes (Signed)
[facility]    CSN: [id number] Arrival date & time: 02/21/22  1400      History   Chief Complaint Chief Complaint  Patient presents with   Headache    HPI Anna Peterson is a 71 y.o. female.   HPI 71 year old female presents with headache, facial pain and pressure for 2 days.  Reports taking OTC Tylenol and Motrin as needed.  PMH significant for incomplete right bundle branch block, osteopenia, OSA, and migraine.  Past Medical History:  Diagnosis Date   Allergy    Asthma    History of chicken pox    Hyperlipidemia    Migraine    OSA (obstructive sleep apnea) 11/10/2017   Severe per home study 7/19   Osteopenia    Osteopenia per Dexa Scan    Patient Active Problem List   Diagnosis Date Noted   Numbness of toes 01/01/2022   Preventative health care 10/12/2020   Precordial chest pain 01/08/2020   Nonspecific abnormal electrocardiogram (ECG) (EKG) 10/04/2019   OSA (obstructive sleep apnea) 11/10/2017   Onychomycosis 12/29/2016   Asthma 12/29/2016   Right knee pain 12/29/2016   Osteopenia 10/14/2016   Incomplete right bundle branch block 10/03/2016   Hyperlipidemia 03/29/2014    Past Surgical History:  Procedure Laterality Date   CESAREAN SECTION     EYE SURGERY     cataract surgery, vitreous removal 2022   KNEE SURGERY Left 2007   meniscus repair    OB History   No obstetric history on file.      Home Medications    Prior to Admission medications   Medication Sig Start Date End Date Taking? Authorizing Provider  azithromycin (ZITHROMAX) 250 MG tablet Take 1 tablet (250 mg total) by mouth daily. Take first 2 tablets together, then 1 every day until finished. 02/21/22  Yes Eliezer Lofts, FNP  fexofenadine [facility] ALLERGY) 180 MG tablet Take 1 tablet (180 mg total) by mouth daily for 15 days. 02/21/22 03/08/22 Yes Eliezer Lofts, FNP  predniSONE (DELTASONE) 20 MG tablet Take 3 tabs PO daily x 5 days. 02/21/22  Yes Eliezer Lofts, FNP   albuterol (VENTOLIN HFA) 108 (90 Base) MCG/ACT inhaler INHALE 2 PUFFS BY MOUTH EVERY 6 HOURS AS NEEDED FOR WHEEZE 10/15/21   [name], NP  Calcium Carbonate-Vitamin D 600-400 MG-UNIT chew tablet Chew 1 tablet by mouth daily. 11/15/18   [name], NP  CITRUS BERGAMOT PO Take by mouth.    [provider]  fluticasone (FLONASE) 50 MCG/ACT nasal spray Place 1 spray into both nostrils daily as needed for allergies or rhinitis.    [provider]  loratadine (CLARITIN) 10 MG tablet Take 10 mg by mouth daily as needed for allergies.    [provider]  Multiple Vitamins-Minerals (MULTIVITAL PO) Take 1 tablet by mouth daily. MNS3. (metabolic nutrition system)    [provider]  Omega-3 Fatty Acids (FISH OIL) 1200 MG CAPS Take 1,200 mg by mouth 2 (two) times daily.    [provider]  rosuvastatin (CRESTOR) 10 MG tablet Take 10 mg by mouth daily.    [provider]  SUMAtriptan (IMITREX) 100 MG tablet Take 1 tablet (100 mg total) by mouth every 2 (two) hours as needed for migraine. May repeat in 2 hours if headache persists or recurs. 05/05/18   Saguier, Percell Miller, PA-C  Zoster Vaccine Adjuvanted [facility]) injection Inject 0.72m IM now and again in 2-6  months 10/15/21   ODebbrah Alar NP    Family  History Family History  Problem Relation Age of Onset   Aneurysm Mother 8       Cerebral age 75   CAD Brother 30   Pancreatic cancer Maternal Grandmother    Arthritis Maternal Grandmother    Stroke Maternal Grandfather 41   Arthritis Maternal Grandfather    Breast cancer Paternal Grandmother 18   Arthritis Paternal Grandmother    Arthritis Paternal Grandfather    CAD Brother 55    Social History Social History   Tobacco Use   Smoking status: Former    Types: Cigarettes    Quit date: 12/06/1970    Years since quitting: 51.2   Smokeless tobacco: Never  Vaping Use   Vaping Use: Never used  Substance Use Topics    Alcohol use: Yes    Alcohol/week: 5.0 standard drinks of alcohol    Types: 5 Glasses of wine per week   Drug use: No     Allergies   Cabbage, Other, Ceclor [cefaclor], and Latex   Review of Systems Review of Systems  HENT:  Positive for sinus pain.   Neurological:  Positive for headaches.  All other systems reviewed and are negative.    Physical Exam Triage Vital Signs ED Triage Vitals  Enc Vitals Group     BP 02/21/22 1436 (!) 143/88     Pulse Rate 02/21/22 1436 66     Resp 02/21/22 1436 16     Temp 02/21/22 1436 98.1 F (36.7 C)     Temp Source 02/21/22 1436 Oral     SpO2 02/21/22 1436 98 %     Weight --      Height --      Head Circumference --      Peak Flow --      Pain Score 02/21/22 1438 3     Pain Loc --      Pain Edu? --      Excl. in [place]? --    No data found.  Updated Vital Signs BP (!) 143/88 (BP Location: Right Arm)   Pulse 66   Temp 98.1 F (36.7 C) (Oral)   Resp 16   LMP 04/07/1997   SpO2 98%    Physical Exam Vitals and nursing note reviewed.  Constitutional:      Appearance: Normal appearance. She is normal weight.  HENT:     Head: Normocephalic and atraumatic.     Right Ear: Tympanic membrane and external ear normal.     Left Ear: Tympanic membrane and external ear normal.     Ears:     Comments: Significant eustachian tube dysfunction noted bilaterally    Nose:     Right Sinus: Maxillary sinus tenderness and frontal sinus tenderness present.     Left Sinus: Maxillary sinus tenderness and frontal sinus tenderness present.     Mouth/Throat:     Mouth: Mucous membranes are moist.     Pharynx: Oropharynx is clear.     Comments: Significant amount of clear drainage of posterior oropharynx noted Eyes:     Extraocular Movements: Extraocular movements intact.     Conjunctiva/sclera: Conjunctivae normal.     Pupils: Pupils are equal, round, and reactive to light.  Cardiovascular:     Rate and Rhythm: Normal rate and regular rhythm.      Pulses: Normal pulses.     Heart sounds: Normal heart sounds. No murmur heard. Pulmonary:     Effort: Pulmonary effort is normal.     Breath sounds: Normal breath  sounds. No wheezing, rhonchi or rales.  Musculoskeletal:        General: Normal range of motion.     Cervical back: Normal range of motion and neck supple.  Skin:    General: Skin is warm and dry.  Neurological:     General: No focal deficit present.     Mental Status: She is alert and oriented to person, place, and time.      UC Treatments / Results  Labs (all labs ordered are listed, but only abnormal results are displayed) Labs Reviewed - No data to display  EKG   Radiology No results found.  Procedures Procedures (including critical care time)  Medications Ordered in UC Medications - No data to display  Initial Impression / Assessment and Plan / UC Course  I have reviewed the triage vital signs and the nursing notes.  Pertinent labs & imaging results that were available during my care of the patient were reviewed by me and considered in my medical decision making (see chart for details).     MDM: 1.  Subacute maxillary sinusitis-Rx'd Zithromax; 2.  Sinus pressure-Rx prednisone; 3.  Allergic rhinitis-Rx'd Allegra. Advised patient to take medication as directed with food to completion.  Advised patient to take prednisone and Allegra with Zithromax daily for the next 5 days.  Advised may use Allegra as needed afterwards for concurrent postnasal drainage/drip.  Encouraged patient to increase daily water intake to 64 ounces per day while taking these medications.  Advised if symptoms worsen and/or unresolved please follow-up PCP or here for further evaluation.  Discharged home, hemodynamically stable. Final Clinical Impressions(s) / UC Diagnoses   Final diagnoses:  Subacute maxillary sinusitis  Sinus pressure  Allergic rhinitis, unspecified seasonality, unspecified trigger     Discharge Instructions       Advised patient to take medication as directed with food to completion.  Advised patient to take prednisone and Allegra with Zithromax daily for the next 5 days.  Advised may use Allegra as needed afterwards for concurrent postnasal drainage/drip.  Encouraged patient to increase daily water intake to 64 ounces per day while taking these medications.  Advised if symptoms worsen and/or unresolved please follow-up PCP or here for further evaluation.     ED Prescriptions     Medication Sig Dispense Auth. Provider   azithromycin (ZITHROMAX) 250 MG tablet Take 1 tablet (250 mg total) by mouth daily. Take first 2 tablets together, then 1 every day until finished. 6 tablet Eliezer Lofts, FNP   predniSONE (DELTASONE) 20 MG tablet Take 3 tabs PO daily x 5 days. 15 tablet Eliezer Lofts, FNP   fexofenadine [facility] ALLERGY) 180 MG tablet Take 1 tablet (180 mg total) by mouth daily for 15 days. 15 tablet Eliezer Lofts, FNP      PDMP not reviewed this encounter.   Eliezer Lofts, [place] 02/21/22 1515

## 2022-02-21 NOTE — Discharge Instructions (Addendum)
Advised patient to take medication as directed with food to completion.  Advised patient to take prednisone and Allegra with Zithromax daily for the next 5 days.  Advised may use Allegra as needed afterwards for concurrent postnasal drainage/drip.  Encouraged patient to increase daily water intake to 64 ounces per day while taking these medications.  Advised if symptoms worsen and/or unresolved please follow-up PCP or here for further evaluation.

## 2022-02-22 IMAGING — CT CT CARDIAC CORONARY ARTERY CALCIUM SCORE
3 series · 14 of 20 positions shown, 15 images · non-contrast
Comparison: None.
COMPARISON: None.

Addendum:
EXAM:
OVER-READ INTERPRETATION  CT CHEST

The following report is an over-read performed by radiologist Dr.
Linn Andrea Fugelli [REDACTED] on 10/11/2019. This
over-read does not include interpretation of cardiac or coronary
anatomy or pathology. The coronary calcium score interpretation by
the cardiologist is attached.
CLINICAL DATA: Risk stratification
Coronary Calcium Score
TECHNIQUE: The patient was scanned on a Siemens Somatom 64 slice scanner. Axial
non-contrast 3 mm slices were carried out through the heart. The
data set was analyzed on a dedicated work station and scored using
the Agatson method.

[Series 2: casc 3.0 bv41 2 bestdiast 73 % · axial · 0.35mm/px · z∈[-256,-172]mm · 4 of 48 slices shown, 5 images]
[im 10/48  vessel]
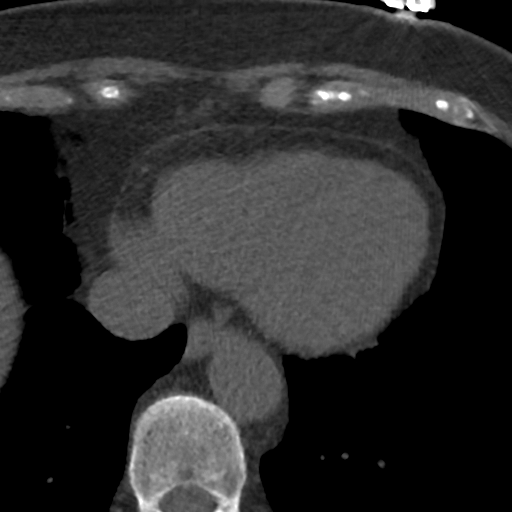
[im 10/48  lung]
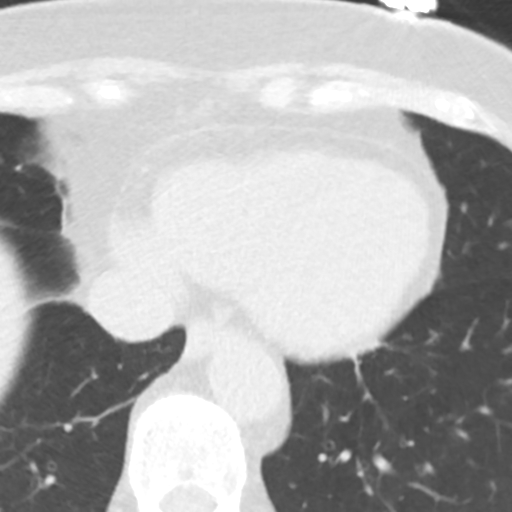
[im 19/48  vessel]
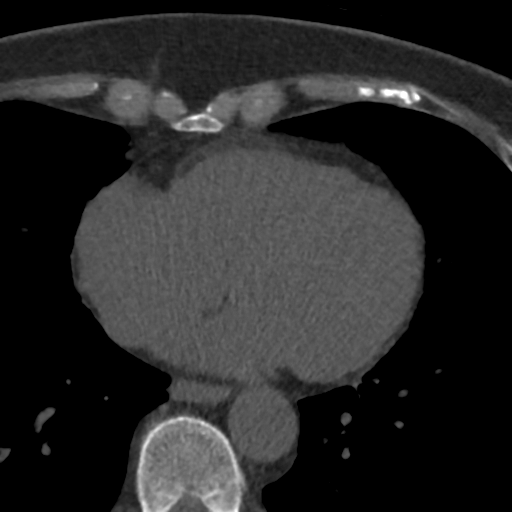
[im 29/48  vessel]
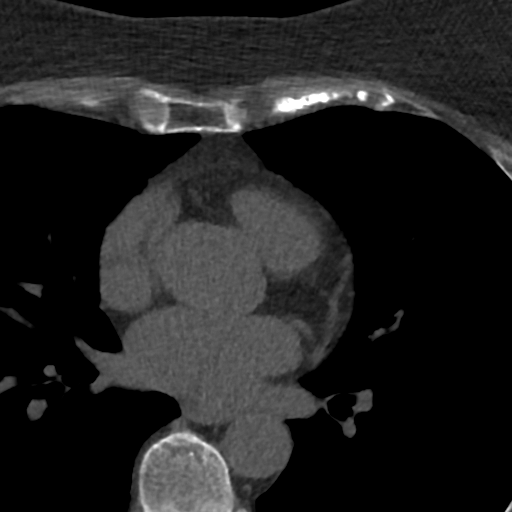
[im 38/48  vessel]
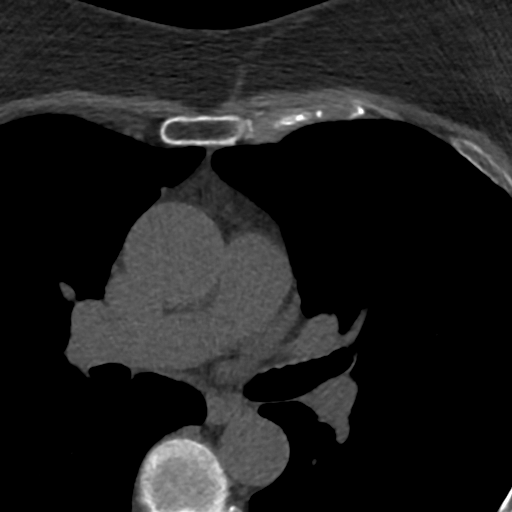

[Series 3: lung 71 % · axial · 0.68mm/px · z∈[-262,-166]mm · 5 of 48 slices shown]
[im 8/48  lung]
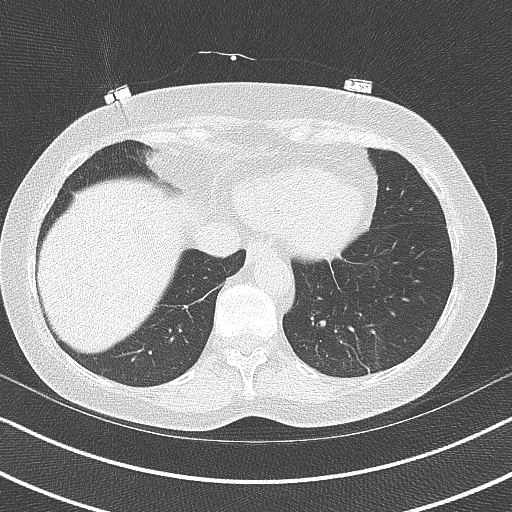
[im 16/48  lung]
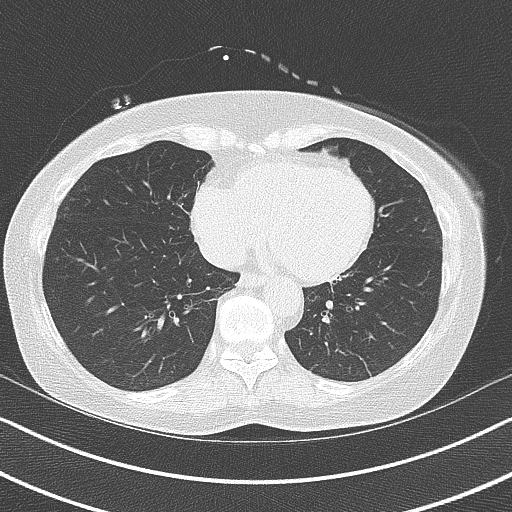
[im 24/48  lung]
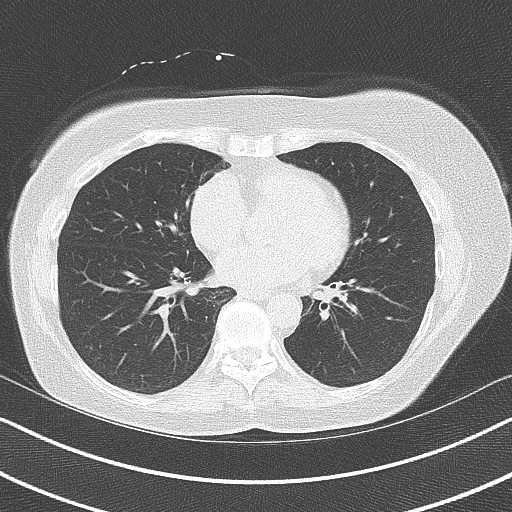
[im 32/48  lung]
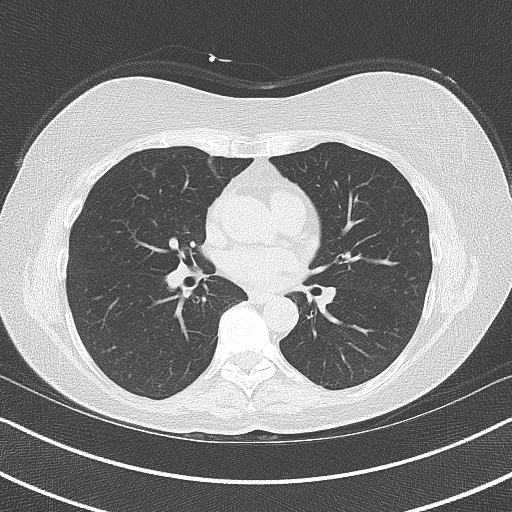
[im 40/48  lung]
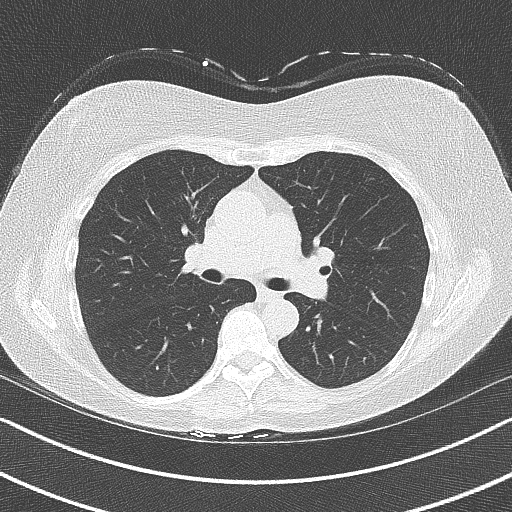

[Series 4: lung st 71 % · axial · 0.68mm/px · z∈[-262,-166]mm · 5 of 48 slices shown]
[im 8/48  lung]
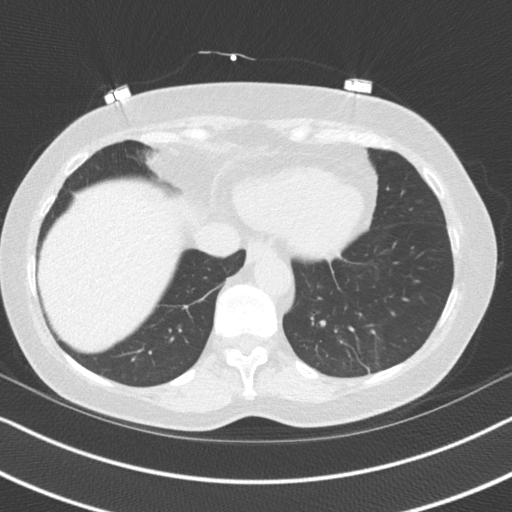
[im 16/48  lung]
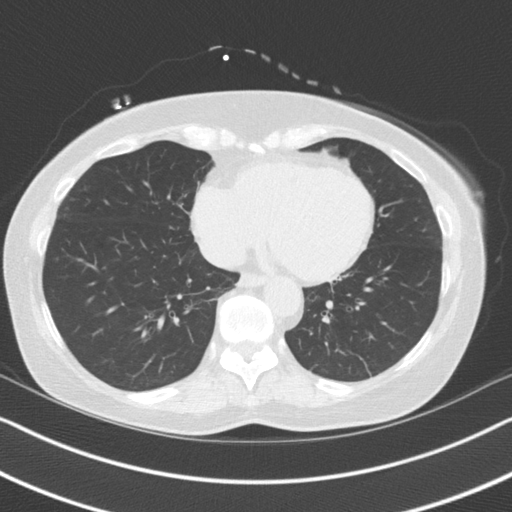
[im 24/48  lung]
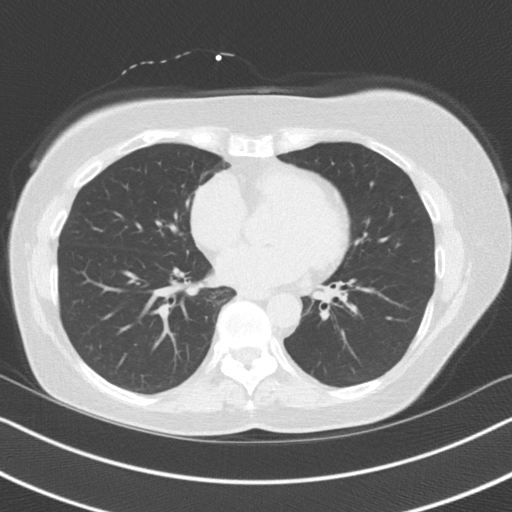
[im 32/48  lung]
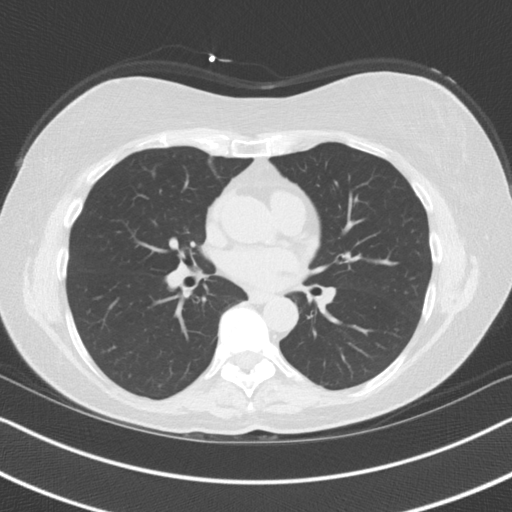
[im 40/48  lung]
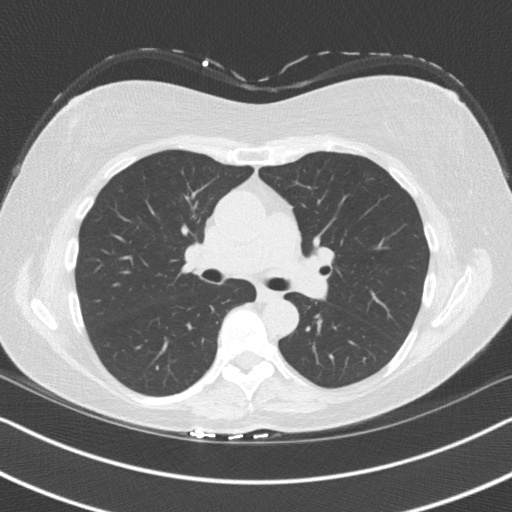

[14 of 20 positions shown; findings below may reference images not displayed]

FINDINGS: Aortic atherosclerosis. Scattered areas of mild linear scarring are
noted in the lung bases bilaterally. Within the visualized portions
of the thorax there are no suspicious appearing pulmonary nodules or
masses, there is no acute consolidative airspace disease, no pleural
effusions, no pneumothorax and no lymphadenopathy. Visualized
portions of the upper abdomen are unremarkable. There are no
aggressive appearing lytic or blastic lesions noted in the
visualized portions of the skeleton.
IMPRESSION: 1.  Aortic Atherosclerosis (V36FQ-3L9.9).
FINDINGS: Non-cardiac: See separate report from [REDACTED].

Ascending aorta: Normal size

Pericardium: Normal

Coronary arteries: Normal origin
IMPRESSION: Coronary calcium score of 0. This was 0 percentile for age and sex
matched control.

Athina Hage

*** End of Addendum ***
EXAM:
OVER-READ INTERPRETATION  CT CHEST

The following report is an over-read performed by radiologist Dr.
Linn Andrea Fugelli [REDACTED] on 10/11/2019. This
over-read does not include interpretation of cardiac or coronary
anatomy or pathology. The coronary calcium score interpretation by
the cardiologist is attached.
FINDINGS: Aortic atherosclerosis. Scattered areas of mild linear scarring are
noted in the lung bases bilaterally. Within the visualized portions
of the thorax there are no suspicious appearing pulmonary nodules or
masses, there is no acute consolidative airspace disease, no pleural
effusions, no pneumothorax and no lymphadenopathy. Visualized
portions of the upper abdomen are unremarkable. There are no
aggressive appearing lytic or blastic lesions noted in the
visualized portions of the skeleton.
IMPRESSION: 1.  Aortic Atherosclerosis (V36FQ-3L9.9).

## 2022-02-24 ENCOUNTER — Ambulatory Visit: Payer: Medicare Other | Admitting: Family Medicine

## 2022-03-03 ENCOUNTER — Ambulatory Visit (HOSPITAL_COMMUNITY): Payer: Medicare Other | Attending: Cardiology

## 2022-03-03 DIAGNOSIS — R9431 Abnormal electrocardiogram [ECG] [EKG]: Secondary | ICD-10-CM | POA: Insufficient documentation

## 2022-03-03 LAB — ECHOCARDIOGRAM COMPLETE
Area-P 1/2: 3.4 cm2
S' Lateral: 2.1 cm

## 2022-03-03 MED ORDER — PERFLUTREN LIPID MICROSPHERE
1.0000 mL | INTRAVENOUS | Status: AC | PRN
  Administered 2022-03-03: 2 mL via INTRAVENOUS

## 2022-03-11 DIAGNOSIS — M9903 Segmental and somatic dysfunction of lumbar region: Secondary | ICD-10-CM | POA: Diagnosis not present

## 2022-03-11 DIAGNOSIS — M62830 Muscle spasm of back: Secondary | ICD-10-CM | POA: Diagnosis not present

## 2022-03-11 DIAGNOSIS — M9905 Segmental and somatic dysfunction of pelvic region: Secondary | ICD-10-CM | POA: Diagnosis not present

## 2022-03-11 DIAGNOSIS — M9904 Segmental and somatic dysfunction of sacral region: Secondary | ICD-10-CM | POA: Diagnosis not present

## 2022-03-27 DIAGNOSIS — M9904 Segmental and somatic dysfunction of sacral region: Secondary | ICD-10-CM | POA: Diagnosis not present

## 2022-03-27 DIAGNOSIS — M9905 Segmental and somatic dysfunction of pelvic region: Secondary | ICD-10-CM | POA: Diagnosis not present

## 2022-03-27 DIAGNOSIS — M9903 Segmental and somatic dysfunction of lumbar region: Secondary | ICD-10-CM | POA: Diagnosis not present

## 2022-03-27 DIAGNOSIS — M62830 Muscle spasm of back: Secondary | ICD-10-CM | POA: Diagnosis not present

## 2022-04-02 ENCOUNTER — Other Ambulatory Visit: Payer: Self-pay | Admitting: Family

## 2022-04-02 ENCOUNTER — Other Ambulatory Visit (HOSPITAL_BASED_OUTPATIENT_CLINIC_OR_DEPARTMENT_OTHER): Payer: Self-pay

## 2022-04-02 MED ORDER — ROSUVASTATIN CALCIUM 10 MG PO TABS
10.0000 mg | ORAL_TABLET | Freq: Every day | ORAL | 1 refills | Status: DC
  Filled 2022-04-15: qty 90, 90d supply, fill #0

## 2022-04-04 ENCOUNTER — Encounter: Payer: Self-pay | Admitting: Family

## 2022-04-04 MED ORDER — ROSUVASTATIN CALCIUM 10 MG PO TABS: 10.0000 mg | ORAL_TABLET | Freq: Every day | ORAL | 1 refills | Status: DC

## 2022-04-15 ENCOUNTER — Other Ambulatory Visit (HOSPITAL_BASED_OUTPATIENT_CLINIC_OR_DEPARTMENT_OTHER): Payer: Self-pay

## 2022-04-15 DIAGNOSIS — M9904 Segmental and somatic dysfunction of sacral region: Secondary | ICD-10-CM | POA: Diagnosis not present

## 2022-04-15 DIAGNOSIS — M9903 Segmental and somatic dysfunction of lumbar region: Secondary | ICD-10-CM | POA: Diagnosis not present

## 2022-04-15 DIAGNOSIS — M62830 Muscle spasm of back: Secondary | ICD-10-CM | POA: Diagnosis not present

## 2022-04-15 DIAGNOSIS — M9905 Segmental and somatic dysfunction of pelvic region: Secondary | ICD-10-CM | POA: Diagnosis not present

## 2022-04-16 ENCOUNTER — Other Ambulatory Visit (HOSPITAL_BASED_OUTPATIENT_CLINIC_OR_DEPARTMENT_OTHER): Payer: Self-pay

## 2022-04-16 ENCOUNTER — Encounter: Payer: Self-pay | Admitting: Family Medicine

## 2022-04-16 ENCOUNTER — Ambulatory Visit (INDEPENDENT_AMBULATORY_CARE_PROVIDER_SITE_OTHER): Payer: Medicare Other | Admitting: Family Medicine

## 2022-04-16 VITALS — BP 138/86 | HR 84 | Temp 98.1°F | Resp 16 | Ht 62.0 in | Wt 162.2 lb

## 2022-04-16 DIAGNOSIS — J0140 Acute pansinusitis, unspecified: Secondary | ICD-10-CM | POA: Diagnosis not present

## 2022-04-16 MED ORDER — AZITHROMYCIN 250 MG PO TABS
ORAL_TABLET | ORAL | 0 refills | Status: AC
  Filled 2022-04-16: qty 6, 5d supply, fill #0

## 2022-04-16 NOTE — Progress Notes (Signed)
Acute Office Visit  Subjective:     Patient ID: Anna Peterson, female    DOB: 24-May-1950, 72 y.o.   MRN: [id number]  Chief Complaint  Patient presents with   Ear Pain    Left ear   Facial Pain    HPI Patient is in today for sinus pressure and left ear discomfort.    Reports L ear pain and pansinusitis pressure/pain for over a week. Thinks she may have some wax build up as well. States she gets occasional sinus infections and responds well to a Zpak. She is supposed to be flying this weekend and really wants to get this taken care of quickly. She has been taking Allegra and Flonase without significant improvement. Symptoms initially started with slightly worsening allergy symptoms and then the ear pain and sinus pressure gradually progressed.  No fevers, coughing, chest pain, shortness of breath.    ROS All review of systems negative except what is listed in the HPI      Objective:    BP 138/86   Pulse 84   Temp 98.1 F (36.7 C)   Resp 16   Ht '5\' 2"'$  (1.575 m)   Wt 162 lb 3.2 oz (73.6 kg)   LMP 04/07/1997   SpO2 96%   BMI 29.67 kg/m    Physical Exam Vitals reviewed.  Constitutional:      Appearance: Normal appearance.  HENT:     Head:     Comments: Maxillary sinus discomfort to palpation     Right Ear: Tympanic membrane normal.     Left Ear: A middle ear effusion is present. Tympanic membrane is erythematous.  Cardiovascular:     Rate and Rhythm: Normal rate and regular rhythm.     Pulses: Normal pulses.     Heart sounds: Normal heart sounds.  Pulmonary:     Effort: Pulmonary effort is normal.     Breath sounds: Normal breath sounds.  Skin:    General: Skin is warm and dry.  Neurological:     Mental Status: She is alert and oriented to person, place, and time.  Psychiatric:        Mood and Affect: Mood normal.        Behavior: Behavior normal.        Thought Content: Thought content normal.        Judgment: Judgment normal.     No results found  for any visits on 04/16/22.      Assessment & Plan:   Problem List Items Addressed This Visit   None Visit Diagnoses     Acute non-recurrent pansinusitis    -  Primary Sinusitis with beginning of some left ear erythema and discomfort. Reports she usually responds well to Zpak when she has this so that is what she would like to take.  Continue Flonase, OTC analgesics.  Continue supportive measures including rest, hydration, humidifier use, steam showers, warm compresses to sinuses, warm liquids with lemon and honey, and over-the-counter cough, cold, and analgesics as needed.  Patient aware of signs/symptoms requiring further/urgent evaluation.    Relevant Medications   azithromycin (ZITHROMAX) 250 MG tablet       Meds ordered this encounter  Medications   azithromycin (ZITHROMAX) 250 MG tablet    Sig: Take 2 tablets on day 1, then 1 tablet daily on days 2 through 5    Dispense:  6 tablet    Refill:  0    Order Specific Question:   Supervising Provider  Answer:   Penni Homans A [[id number]]    Return if symptoms worsen or fail to improve.  Terrilyn Saver, NP

## 2022-05-19 DIAGNOSIS — G4733 Obstructive sleep apnea (adult) (pediatric): Secondary | ICD-10-CM | POA: Diagnosis not present

## 2022-05-20 DIAGNOSIS — M9905 Segmental and somatic dysfunction of pelvic region: Secondary | ICD-10-CM | POA: Diagnosis not present

## 2022-05-20 DIAGNOSIS — M62830 Muscle spasm of back: Secondary | ICD-10-CM | POA: Diagnosis not present

## 2022-05-20 DIAGNOSIS — M9903 Segmental and somatic dysfunction of lumbar region: Secondary | ICD-10-CM | POA: Diagnosis not present

## 2022-05-20 DIAGNOSIS — M9904 Segmental and somatic dysfunction of sacral region: Secondary | ICD-10-CM | POA: Diagnosis not present

## 2022-06-17 DIAGNOSIS — M62830 Muscle spasm of back: Secondary | ICD-10-CM | POA: Diagnosis not present

## 2022-06-17 DIAGNOSIS — M9904 Segmental and somatic dysfunction of sacral region: Secondary | ICD-10-CM | POA: Diagnosis not present

## 2022-06-17 DIAGNOSIS — M9903 Segmental and somatic dysfunction of lumbar region: Secondary | ICD-10-CM | POA: Diagnosis not present

## 2022-06-17 DIAGNOSIS — M9905 Segmental and somatic dysfunction of pelvic region: Secondary | ICD-10-CM | POA: Diagnosis not present

## 2022-06-22 ENCOUNTER — Other Ambulatory Visit: Payer: Self-pay | Admitting: Family

## 2022-07-02 ENCOUNTER — Encounter: Payer: Self-pay | Admitting: Family

## 2022-07-02 ENCOUNTER — Other Ambulatory Visit (HOSPITAL_BASED_OUTPATIENT_CLINIC_OR_DEPARTMENT_OTHER): Payer: Self-pay

## 2022-07-02 ENCOUNTER — Ambulatory Visit (INDEPENDENT_AMBULATORY_CARE_PROVIDER_SITE_OTHER): Payer: Medicare Other | Admitting: Family

## 2022-07-02 VITALS — BP 137/85 | HR 65 | Temp 98.0°F | Resp 16 | Wt 163.0 lb

## 2022-07-02 DIAGNOSIS — R635 Abnormal weight gain: Secondary | ICD-10-CM | POA: Diagnosis not present

## 2022-07-02 DIAGNOSIS — J45909 Unspecified asthma, uncomplicated: Secondary | ICD-10-CM | POA: Diagnosis not present

## 2022-07-02 DIAGNOSIS — E782 Mixed hyperlipidemia: Secondary | ICD-10-CM | POA: Diagnosis not present

## 2022-07-02 DIAGNOSIS — G4733 Obstructive sleep apnea (adult) (pediatric): Secondary | ICD-10-CM

## 2022-07-02 LAB — TSH: TSH: 1.77 u[IU]/mL (ref 0.35–5.50)

## 2022-07-02 MED ORDER — SHINGRIX 50 MCG/0.5ML IM SUSR
INTRAMUSCULAR | 1 refills | Status: DC
  Filled 2022-07-02: qty 0.5, 1d supply, fill #0

## 2022-07-02 NOTE — Assessment & Plan Note (Signed)
Now on cpap, no longer having migraines.

## 2022-07-02 NOTE — Assessment & Plan Note (Signed)
Stable with prn albuterol.  

## 2022-07-02 NOTE — Progress Notes (Signed)
Subjective:   By signing my name below, I, [name], attest that this documentation has been prepared under the direction and in the presence of [name], NP. 07/02/2022   Patient ID: Anna Peterson, female    DOB: 01-14-1951, 72 y.o.   MRN: [id number]  Chief Complaint  Patient presents with   Hyperlipidemia    Here for follow up   Sleep Apnea    Here for follow up    HPI Patient is in today for a follow up visit.   Numbness in toes: She reports numbness in her toes has resolved since seeing her chiropractor.   Weight gain: She has gained weight after stopping allegra and starting 10 mg Crestor daily PO.  Wt Readings from Last 3 Encounters:  07/02/22 163 lb (73.9 kg)  04/16/22 162 lb 3.2 oz (73.6 kg)  02/10/22 161 lb (73 kg)   Asthma: She is managing her asthma well and has albuterol incase her symptoms worsen.   Cholesterol: Her cholesterol levels improved. She continues taking 10 mg Crestor daily PO. Lab Results  Component Value Date   CHOL 194 01/01/2022   HDL 62.20 01/01/2022   LDLCALC 114 (H) 01/01/2022   TRIG 92.0 01/01/2022   CHOLHDL 3 01/01/2022   CPAP: She continues wearing a CPAP machine while sleeping. Her migraines have reduced considerably since she started using her CPAP machine.   Immunizations: She is not interested in receiving the latest Covid-19 booster vaccine.   Past Medical History:  Diagnosis Date   Allergy    Asthma    History of chicken pox    Hyperlipidemia    Migraine    OSA (obstructive sleep apnea) 11/10/2017   Severe per home study 7/19   Osteopenia    Osteopenia per Dexa Scan    Past Surgical History:  Procedure Laterality Date   CESAREAN SECTION     EYE SURGERY     cataract surgery, vitreous removal 2022   KNEE SURGERY Left 2007   meniscus repair    Family History  Problem Relation Age of Onset   Aneurysm Mother 82       Cerebral age 53   CAD Brother 54   Pancreatic cancer Maternal Grandmother     Arthritis Maternal Grandmother    Stroke Maternal Grandfather 66   Arthritis Maternal Grandfather    Breast cancer Paternal Grandmother 67   Arthritis Paternal Grandmother    Arthritis Paternal Grandfather    CAD Brother 29    Social History   Socioeconomic History   Marital status: Married    Spouse name: Not on file   Number of children: Not on file   Years of education: Not on file   Highest education level: Not on file  Occupational History   Not on file  Tobacco Use   Smoking status: Former    Types: Cigarettes    Quit date: 12/06/1970    Years since quitting: 51.6   Smokeless tobacco: Never  Vaping Use   Vaping Use: Never used  Substance and Sexual Activity   Alcohol use: Yes    Alcohol/week: 5.0 standard drinks of alcohol    Types: 5 Glasses of wine per week   Drug use: No   Sexual activity: Not on file  Other Topics Concern   Not on file  Social History Narrative   Retired Marine scientist, 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  Live in 4 different states.  Oldest son Corene Cornea lives locally. Second son Shanon Brow- lives in [place], daughter lives in [place], [place] lives on [place]   No pets++   Married for 43 years.     Social Determinants of Health   Financial Resource Strain: Low Risk  (01/03/2022)   Overall Financial Resource Strain (CARDIA)    Difficulty of Paying Living Expenses: Not hard at all  Food Insecurity: No Food Insecurity (01/03/2022)   Hunger Vital Sign    Worried About Running Out of Food in the Last Year: Never true    Ran Out of Food in the Last Year: Never true  Transportation Needs: No Transportation Needs (01/03/2022)   PRAPARE - Hydrologist (Medical): No    Lack of Transportation (Non-Medical): No  Physical Activity: Inactive (01/03/2022)   Exercise Vital Sign     Days of Exercise per Week: 0 days    Minutes of Exercise per Session: 0 min  Stress: No Stress Concern Present (01/03/2022)   [place]    Feeling of Stress : Not at all  Social Connections: [place] (01/03/2022)   Social Connection and Isolation Panel [NHANES]    Frequency of Communication with Friends and Family: More than three times a week    Frequency of Social Gatherings with Friends and Family: More than three times a week    Attends Religious Services: More than 4 times per year    Active Member of [employer] or Organizations: Yes    Attends Archivist Meetings: More than 4 times per year    Marital Status: Married  Human resources officer Violence: Not At Risk (01/03/2022)   Humiliation, Afraid, Rape, and Kick questionnaire    Fear of Current or Ex-Partner: No    Emotionally Abused: No    Physically Abused: No    Sexually Abused: No    Outpatient Medications Prior to Visit  Medication Sig Dispense Refill   albuterol (VENTOLIN HFA) 108 (90 Base) MCG/ACT inhaler INHALE 2 PUFFS BY MOUTH EVERY 6 HOURS AS NEEDED FOR WHEEZING 8.5 each 2   Calcium Carbonate-Vitamin D 600-400 MG-UNIT chew tablet Chew 1 tablet by mouth daily.     fluticasone (FLONASE) 50 MCG/ACT nasal spray Place 1 spray into both nostrils daily as needed for allergies or rhinitis.     Multiple Vitamins-Minerals (MULTIVITAL PO) Take 1 tablet by mouth daily. MNS3. (metabolic nutrition system)     Omega-3 Fatty Acids (FISH OIL) 1200 MG CAPS Take 1,200 mg by mouth 2 (two) times daily.     rosuvastatin (CRESTOR) 10 MG tablet Take 1 tablet (10 mg total) by mouth daily. 90 tablet 1   SUMAtriptan (IMITREX) 100 MG tablet Take 1 tablet (100 mg total) by mouth every 2 (two) hours as needed for migraine. May repeat in 2 hours if headache persists or recurs. 10 tablet 1   Zoster Vaccine Adjuvanted [facility]) injection Inject 0.51ml IM now and again in 2-6   months 0.5 mL 1   CITRUS BERGAMOT PO Take by mouth.     fexofenadine (ALLEGRA ALLERGY) 180 MG tablet Take 1 tablet (180 mg total) by mouth daily for 15 days. 15 tablet 0   No facility-administered medications prior to visit.    Allergies  Allergen Reactions   Cabbage     "Migraine"  Other Swelling    Peaches   Ceclor [Cefaclor] Rash   Latex Rash    ROS    See HPI Objective:    Physical Exam Constitutional:      General: She is not in acute distress.    Appearance: Normal appearance. She is not ill-appearing.  HENT:     Head: Normocephalic and atraumatic.     Right Ear: External ear normal.     Left Ear: External ear normal.  Eyes:     Extraocular Movements: Extraocular movements intact.     Pupils: Pupils are equal, round, and reactive to light.  Cardiovascular:     Rate and Rhythm: Normal rate and regular rhythm.     Heart sounds: Normal heart sounds. No murmur heard.    No gallop.  Pulmonary:     Effort: Pulmonary effort is normal. No respiratory distress.     Breath sounds: Normal breath sounds. No wheezing or rales.  Skin:    General: Skin is warm and dry.  Neurological:     Mental Status: She is alert and oriented to person, place, and time.  Psychiatric:        Judgment: Judgment normal.     BP 137/85 (BP Location: Right Arm, Patient Position: Sitting, Cuff Size: Small)   Pulse 65   Temp 98 F (36.7 C) (Oral)   Resp 16   Wt 163 lb (73.9 kg)   LMP 04/07/1997   SpO2 100%   BMI 29.81 kg/m  Wt Readings from Last 3 Encounters:  07/02/22 163 lb (73.9 kg)  04/16/22 162 lb 3.2 oz (73.6 kg)  02/10/22 161 lb (73 kg)       Assessment & Plan:  Weight gain -     TSH  Uncomplicated asthma, unspecified asthma severity, unspecified whether persistent Assessment & Plan: Stable with prn albuterol.     Mixed hyperlipidemia Assessment & Plan: Lab Results  Component Value Date   CHOL 194 01/01/2022   HDL 62.20 01/01/2022   LDLCALC 114 (H) 01/01/2022    TRIG 92.0 01/01/2022   CHOLHDL 3 01/01/2022   Stable/improved on rosuvastatin.  Continue same.    OSA (obstructive sleep apnea) Assessment & Plan: Now on cpap, no longer having migraines.      I, Nance Pear, NP, personally preformed the services described in this documentation.  All medical record entries made by the scribe were at my direction and in my presence.  I have reviewed the chart and discharge instructions (if applicable) and agree that the record reflects my personal performance and is accurate and complete. 07/02/2022   I,[name],acting as a scribe for Nance Pear, NP.,have documented all relevant documentation on the behalf of Nance Pear, NP,as directed by  Nance Pear, NP while in the presence of Nance Pear, NP.   Nance Pear, NP

## 2022-07-02 NOTE — Assessment & Plan Note (Addendum)
Lab Results  Component Value Date   CHOL 194 01/01/2022   HDL 62.20 01/01/2022   LDLCALC 114 (H) 01/01/2022   TRIG 92.0 01/01/2022   CHOLHDL 3 01/01/2022   Stable/improved on rosuvastatin.  Continue same.

## 2022-07-04 DIAGNOSIS — H40053 Ocular hypertension, bilateral: Secondary | ICD-10-CM | POA: Diagnosis not present

## 2022-07-04 DIAGNOSIS — Z961 Presence of intraocular lens: Secondary | ICD-10-CM | POA: Diagnosis not present

## 2022-07-04 DIAGNOSIS — H5213 Myopia, bilateral: Secondary | ICD-10-CM | POA: Diagnosis not present

## 2022-07-04 DIAGNOSIS — H40003 Preglaucoma, unspecified, bilateral: Secondary | ICD-10-CM | POA: Diagnosis not present

## 2022-07-04 DIAGNOSIS — H52223 Regular astigmatism, bilateral: Secondary | ICD-10-CM | POA: Diagnosis not present

## 2022-07-07 DIAGNOSIS — G44219 Episodic tension-type headache, not intractable: Secondary | ICD-10-CM | POA: Diagnosis not present

## 2022-07-10 ENCOUNTER — Ambulatory Visit: Payer: Medicare Other | Admitting: Family Medicine

## 2022-07-10 NOTE — Progress Notes (Deleted)
[facility] [address] [place], [place] [address] [id number] [phone]  Date:  07/10/2022   Name:  Anna Peterson   DOB:  25-Feb-1951   MRN:  [id number]  PCP:  [name], NP    Chief Complaint: No chief complaint on file.   History of Present Illness:  Anna Peterson is a 72 y.o. very pleasant female patient who presents with the following:  Primary patient of [name] Here today with concern of needing a tetanus booster   Last tetanus 2021  Patient Active Problem List   Diagnosis Date Noted   Numbness of toes 01/01/2022   Preventative health care 10/12/2020   Precordial chest pain 01/08/2020   Nonspecific abnormal electrocardiogram (ECG) (EKG) 10/04/2019   OSA (obstructive sleep apnea) 11/10/2017   Onychomycosis 12/29/2016   Asthma 12/29/2016   Right knee pain 12/29/2016   Osteopenia 10/14/2016   Incomplete right bundle branch block 10/03/2016   Hyperlipidemia 03/29/2014    Past Medical History:  Diagnosis Date   Allergy    Asthma    History of chicken pox    Hyperlipidemia    Migraine    OSA (obstructive sleep apnea) 11/10/2017   Severe per home study 7/19   Osteopenia    Osteopenia per Dexa Scan    Past Surgical History:  Procedure Laterality Date   CESAREAN SECTION     EYE SURGERY     cataract surgery, vitreous removal 2022   KNEE SURGERY Left 2007   meniscus repair    Social History   Tobacco Use   Smoking status: Former    Types: Cigarettes    Quit date: 12/06/1970    Years since quitting: 51.6   Smokeless tobacco: Never  Vaping Use   Vaping Use: Never used  Substance Use Topics   Alcohol use: Yes    Alcohol/week: 5.0 standard drinks of alcohol    Types: 5 Glasses of wine per week   Drug use: No    Family History  Problem Relation Age of Onset   Aneurysm Mother 15       Cerebral age 35   CAD Brother 28   Pancreatic cancer Maternal Grandmother    Arthritis  Maternal Grandmother    Stroke Maternal Grandfather 58   Arthritis Maternal Grandfather    Breast cancer Paternal Grandmother 9   Arthritis Paternal Grandmother    Arthritis Paternal Grandfather    CAD Brother 39    Allergies  Allergen Reactions   Cabbage     "Migraine"   Other Swelling    Peaches   Ceclor [Cefaclor] Rash   Latex Rash    Medication list has been reviewed and updated.  Current Outpatient Medications on File Prior to Visit  Medication Sig Dispense Refill   albuterol (VENTOLIN HFA) 108 (90 Base) MCG/ACT inhaler INHALE 2 PUFFS BY MOUTH EVERY 6 HOURS AS NEEDED FOR WHEEZING 8.5 each 2   Calcium Carbonate-Vitamin D 600-400 MG-UNIT chew tablet Chew 1 tablet by mouth daily.     fluticasone (FLONASE) 50 MCG/ACT nasal spray Place 1 spray into both nostrils daily as needed for allergies or rhinitis.     Multiple Vitamins-Minerals (MULTIVITAL PO) Take 1 tablet by mouth daily. MNS3. (metabolic nutrition system)     Omega-3 Fatty Acids (FISH OIL) 1200 MG CAPS Take 1,200 mg by mouth 2 (two) times daily.     rosuvastatin (CRESTOR) 10 MG tablet Take 1 tablet (10 mg total)  by mouth daily. 90 tablet 1   SUMAtriptan (IMITREX) 100 MG tablet Take 1 tablet (100 mg total) by mouth every 2 (two) hours as needed for migraine. May repeat in 2 hours if headache persists or recurs. 10 tablet 1   Zoster Vaccine Adjuvanted [facility]) injection Inject 0.62ml IM now and again in 2-6  months 0.5 mL 1   Zoster Vaccine Adjuvanted [facility]) injection Inject into the muscle. 0.5 mL 1   No current facility-administered medications on file prior to visit.    Review of Systems:  ***  Physical Examination: There were no vitals filed for this visit. There were no vitals filed for this visit. There is no height or weight on file to calculate BMI. Ideal Body Weight:    ***  Assessment and Plan: ***  Signed Lamar Blinks, MD

## 2022-07-15 DIAGNOSIS — M9903 Segmental and somatic dysfunction of lumbar region: Secondary | ICD-10-CM | POA: Diagnosis not present

## 2022-07-15 DIAGNOSIS — M9904 Segmental and somatic dysfunction of sacral region: Secondary | ICD-10-CM | POA: Diagnosis not present

## 2022-07-15 DIAGNOSIS — M62830 Muscle spasm of back: Secondary | ICD-10-CM | POA: Diagnosis not present

## 2022-07-15 DIAGNOSIS — M9905 Segmental and somatic dysfunction of pelvic region: Secondary | ICD-10-CM | POA: Diagnosis not present

## 2022-08-19 DIAGNOSIS — M9903 Segmental and somatic dysfunction of lumbar region: Secondary | ICD-10-CM | POA: Diagnosis not present

## 2022-08-19 DIAGNOSIS — M9905 Segmental and somatic dysfunction of pelvic region: Secondary | ICD-10-CM | POA: Diagnosis not present

## 2022-08-19 DIAGNOSIS — M62830 Muscle spasm of back: Secondary | ICD-10-CM | POA: Diagnosis not present

## 2022-08-19 DIAGNOSIS — M9904 Segmental and somatic dysfunction of sacral region: Secondary | ICD-10-CM | POA: Diagnosis not present

## 2022-08-22 DIAGNOSIS — G4733 Obstructive sleep apnea (adult) (pediatric): Secondary | ICD-10-CM | POA: Diagnosis not present

## 2022-08-25 ENCOUNTER — Telehealth: Payer: Self-pay | Admitting: Podiatry

## 2022-08-25 NOTE — Telephone Encounter (Signed)
Inflamed toenail. New patient never been here before. Please call for appt.

## 2022-08-26 NOTE — Telephone Encounter (Signed)
Left message for pt to call to schedule an appt. 

## 2022-08-27 ENCOUNTER — Ambulatory Visit: Payer: Medicare Other | Admitting: Podiatry

## 2022-08-27 DIAGNOSIS — L03031 Cellulitis of right toe: Secondary | ICD-10-CM | POA: Diagnosis not present

## 2022-08-27 MED ORDER — AMOXICILLIN-POT CLAVULANATE 875-125 MG PO TABS: 1.0000 | ORAL_TABLET | Freq: Two times a day (BID) | ORAL | 0 refills | Status: DC

## 2022-08-27 MED ORDER — MUPIROCIN 2 % EX OINT: 1.0000 | TOPICAL_OINTMENT | Freq: Two times a day (BID) | CUTANEOUS | 1 refills | Status: DC

## 2022-08-27 NOTE — Progress Notes (Signed)
   Chief Complaint  Patient presents with   Ingrown Toenail    Patient came in today for right hallux ingrown, started 5 days ago, patient has swelling redness and drainage,     HPI: 72 y.o. female presenting today as a new patient for evaluation of redness with swelling to the medial border of the right great toenail.  Patient states that about 5 days ago she trimmed her nails and pulled some of the skin from the medial border of the right hallux nail plate.  She subsequently developed redness with swelling and paronychia.  She says when she called for the appointment she actually had purulent drainage coming from the toenail.  Since that time she has had some improvement.  She presents for further treatment evaluation.  She never has a history of ingrown toenails  Past Medical History:  Diagnosis Date   Allergy    Asthma    History of chicken pox    Hyperlipidemia    Migraine    OSA (obstructive sleep apnea) 11/10/2017   Severe per home study 7/19   Osteopenia    Osteopenia per Dexa Scan    Past Surgical History:  Procedure Laterality Date   CESAREAN SECTION     EYE SURGERY     cataract surgery, vitreous removal 2022   KNEE SURGERY Left 2007   meniscus repair    Allergies  Allergen Reactions   Cabbage     "Migraine"   Other Swelling    Peaches   Ceclor [Cefaclor] Rash   Latex Rash      Right hallux nail plate 9/52/8413  Physical Exam: General: The patient is alert and oriented x3 in no acute distress.  Dermatology: Skin is warm, dry and supple bilateral lower extremities.  There is some redness with swelling localized to the medial border of the right hallux nail plate.  No purulence today.  Overall there is significant improvement of the tenderness associated to the toenail according to the patient  Vascular: Palpable pedal pulses bilaterally. Capillary refill within normal limits.   Neurological: Grossly intact via light touch  Musculoskeletal Exam: No pedal  deformities noted   Assessment/Plan of Care: 1.  Paronychia right hallux nail plate medial border  -Patient evaluated -The patient does not have a history of ingrown toenails and she says of the last few days there is actually been significant improvement.  We will pursue conservative treatment for now.  Patient agrees -Prescription for Augmentin 875/125 mg #20 twice daily x 10 days -Prescription for mupirocin ointment apply 2 times daily with a Band-Aid -Continue daily Epsom salt soaks -Return to clinic in 2 weeks.  If there is no improvement recommend partial nail matricectomy of the medial border of the right hallux nail plate.  Patient agrees       Felecia Shelling, DPM [facility]  Dr. Felecia Shelling, DPM    [address] [place], [place] [address]                Office [phone]  Fax [phone]

## 2022-09-07 ENCOUNTER — Other Ambulatory Visit: Payer: Self-pay

## 2022-09-07 ENCOUNTER — Ambulatory Visit
Admission: EM | Admit: 2022-09-07 | Discharge: 2022-09-07 | Disposition: A | Discharge status: Home / Self Care | Payer: Medicare Other | Attending: Family Medicine | Admitting: Family Medicine

## 2022-09-07 ENCOUNTER — Encounter: Payer: Self-pay | Admitting: Emergency Medicine

## 2022-09-07 DIAGNOSIS — L03116 Cellulitis of left lower limb: Secondary | ICD-10-CM

## 2022-09-07 DIAGNOSIS — W57XXXA Bitten or stung by nonvenomous insect and other nonvenomous arthropods, initial encounter: Secondary | ICD-10-CM | POA: Diagnosis not present

## 2022-09-07 MED ORDER — DOXYCYCLINE HYCLATE 100 MG PO CAPS: 100.0000 mg | ORAL_CAPSULE | Freq: Two times a day (BID) | ORAL | 0 refills | Status: AC

## 2022-09-07 NOTE — Discharge Instructions (Addendum)
Instructed patient to take medication as directed with food to completion.  Encouraged increase daily water intake to 64 ounces per day while taking this medication.  Advised if symptoms worsen and/or unresolved please follow-up with PCP for further evaluation.

## 2022-09-07 NOTE — ED Triage Notes (Signed)
Patient c/o possible bug bite or tick bite on left lower leg x 4 days.  The area is red and small in size, itchy.  Just finished Amoxil from a toe infection.

## 2022-09-07 NOTE — ED Provider Notes (Signed)
[facility]    CSN: [id number] Arrival date & time: 09/07/22  0909      History   Chief Complaint Chief Complaint  Patient presents with   Insect Bite    HPI Anna Peterson is a 72 y.o. female.   HPI   Pleasant 72 year old female presents with possible bug or tick bite of the left lower leg.  Patient is unsure of what bit her and believes this happened on Wednesday (09/03/2022) of this past week. Patient reports just completing course of Amoxicillin from a previous toe infection.   PMH significant for HLD, asthma and OSA.  Past Medical History:  Diagnosis Date   Allergy    Asthma    History of chicken pox    Hyperlipidemia    Migraine    OSA (obstructive sleep apnea) 11/10/2017   Severe per home study 7/19   Osteopenia    Osteopenia per Dexa Scan    Patient Active Problem List   Diagnosis Date Noted   Numbness of toes 01/01/2022   Preventative health care 10/12/2020   Precordial chest pain 01/08/2020   Nonspecific abnormal electrocardiogram (ECG) (EKG) 10/04/2019   OSA (obstructive sleep apnea) 11/10/2017   Onychomycosis 12/29/2016   Asthma 12/29/2016   Right knee pain 12/29/2016   Osteopenia 10/14/2016   Incomplete right bundle branch block 10/03/2016   Hyperlipidemia 03/29/2014    Past Surgical History:  Procedure Laterality Date   CESAREAN SECTION     EYE SURGERY     cataract surgery, vitreous removal 2022   KNEE SURGERY Left 2007   meniscus repair    OB History   No obstetric history on file.      Home Medications    Prior to Admission medications   Medication Sig Start Date End Date Taking? Authorizing Provider  albuterol (VENTOLIN HFA) 108 (90 Base) MCG/ACT inhaler INHALE 2 PUFFS BY MOUTH EVERY 6 HOURS AS NEEDED FOR WHEEZING 06/22/22  Yes Sandford Craze, NP  Calcium Carbonate-Vitamin D 600-400 MG-UNIT chew tablet Chew 1 tablet by mouth daily. 11/15/18  Yes Sandford Craze, NP  doxycycline (VIBRAMYCIN) 100 MG capsule Take 1  capsule (100 mg total) by mouth 2 (two) times daily for 10 days. 09/07/22 09/17/22 Yes Trevor Iha, FNP  fluticasone (FLONASE) 50 MCG/ACT nasal spray Place 1 spray into both nostrils daily as needed for allergies or rhinitis.   Yes [provider]  Multiple Vitamins-Minerals (MULTIVITAL PO) Take 1 tablet by mouth daily. MNS3. (metabolic nutrition system)   Yes [provider]  mupirocin ointment (BACTROBAN) 2 % Apply 1 Application topically 2 (two) times daily. 08/27/22  Yes Felecia Shelling, DPM  Omega-3 Fatty Acids (FISH OIL) 1200 MG CAPS Take 1,200 mg by mouth 2 (two) times daily.   Yes [provider]  rosuvastatin (CRESTOR) 10 MG tablet Take 1 tablet (10 mg total) by mouth daily. 04/04/22  Yes Sandford Craze, NP  SUMAtriptan (IMITREX) 100 MG tablet Take 1 tablet (100 mg total) by mouth every 2 (two) hours as needed for migraine. May repeat in 2 hours if headache persists or recurs. 05/05/18  Yes Saguier, Ramon Dredge, PA-C  Zoster Vaccine Adjuvanted [facility]) injection Inject 0.72ml IM now and again in 2-6  months 10/15/21  Yes Sandford Craze, NP  Zoster Vaccine Adjuvanted [facility]) injection Inject into the muscle. 07/02/22  Yes Judyann Munson, MD    Family History Family History  Problem Relation Age of Onset   Aneurysm Mother 42       Cerebral  age 51   CAD Brother 75   Pancreatic cancer Maternal Grandmother    Arthritis Maternal Grandmother    Stroke Maternal Grandfather 50   Arthritis Maternal Grandfather    Breast cancer Paternal Grandmother 63   Arthritis Paternal Grandmother    Arthritis Paternal Grandfather    CAD Brother 55    Social History Social History   Tobacco Use   Smoking status: Former    Types: Cigarettes    Quit date: 12/06/1970    Years since quitting: 51.7   Smokeless tobacco: Never  Vaping Use   Vaping Use: Never used  Substance Use Topics   Alcohol use: Yes    Alcohol/week: 5.0 standard drinks of alcohol    Types: 5  Glasses of wine per week   Drug use: No     Allergies   Cabbage, Other, Ceclor [cefaclor], and Latex   Review of Systems Review of Systems  Skin:  Positive for rash.  All other systems reviewed and are negative.    Physical Exam Triage Vital Signs ED Triage Vitals  Enc Vitals Group     BP 09/07/22 0929 (!) 146/83     Pulse Rate 09/07/22 0929 80     Resp 09/07/22 0929 16     Temp 09/07/22 0929 98.4 F (36.9 C)     Temp Source 09/07/22 0929 Oral     SpO2 09/07/22 0929 99 %     Weight 09/07/22 0931 164 lb (74.4 kg)     Height 09/07/22 0931 5\' 2"  (1.575 m)     Head Circumference --      Peak Flow --      Pain Score 09/07/22 0930 2     Pain Loc --      Pain Edu? --      Excl. in GC? --    No data found.  Updated Vital Signs BP (!) 146/83 (BP Location: Right Arm)   Pulse 80   Temp 98.4 F (36.9 C) (Oral)   Resp 16   Ht 5\' 2"  (1.575 m)   Wt 164 lb (74.4 kg)   LMP 04/07/1997   SpO2 99%   BMI 30.00 kg/m      Physical Exam Vitals and nursing note reviewed.  Constitutional:      Appearance: Normal appearance. She is normal weight.  HENT:     Head: Normocephalic and atraumatic.     Mouth/Throat:     Mouth: Mucous membranes are moist.     Pharynx: Oropharynx is clear.  Eyes:     Extraocular Movements: Extraocular movements intact.     Conjunctiva/sclera: Conjunctivae normal.     Pupils: Pupils are equal, round, and reactive to light.  Cardiovascular:     Rate and Rhythm: Normal rate and regular rhythm.     Pulses: Normal pulses.     Heart sounds: Normal heart sounds.  Pulmonary:     Effort: Pulmonary effort is normal.     Breath sounds: Normal breath sounds. No wheezing, rhonchi or rales.  Musculoskeletal:        General: Normal range of motion.     Cervical back: Normal range of motion and neck supple.  Skin:    General: Skin is warm and dry.     Comments: Left lower leg (superior medial aspect): Erythematous circular shaped papule with target insect  bite mark-please see image below  Neurological:     General: No focal deficit present.     Mental Status: She is alert  and oriented to person, place, and time. Mental status is at baseline.  Psychiatric:        Mood and Affect: Mood normal.        Behavior: Behavior normal.        Thought Content: Thought content normal.      UC Treatments / Results  Labs (all labs ordered are listed, but only abnormal results are displayed) Labs Reviewed - No data to display  EKG   Radiology No results found.  Procedures Procedures (including critical care time)  Medications Ordered in UC Medications - No data to display  Initial Impression / Assessment and Plan / UC Course  I have reviewed the triage vital signs and the nursing notes.  Pertinent labs & imaging results that were available during my care of the patient were reviewed by me and considered in my medical decision making (see chart for details).     MDM: 1.  Cellulitis of leg, left-Rx'd Doxycycline 100 mg capsule twice daily x 10 days; 2.  Bug bite with infection, initial encounter-Rx'd Doxycycline 100 mg capsule twice daily x 10 days. Instructed patient to take medication as directed with food to completion.  Encouraged increase daily water intake to 64 ounces per day while taking this medication.  Advised if symptoms worsen and/or unresolved please follow-up with PCP for further evaluation.  Patient discharged home, hemodynamically stable.  Final Clinical Impressions(s) / UC Diagnoses   Final diagnoses:  Bug bite with infection, initial encounter  Cellulitis of leg, left     Discharge Instructions      Instructed patient to take medication as directed with food to completion.  Encouraged increase daily water intake to 64 ounces per day while taking this medication.  Advised if symptoms worsen and/or unresolved please follow-up with PCP for further evaluation.     ED Prescriptions     Medication Sig Dispense Auth.  Provider   doxycycline (VIBRAMYCIN) 100 MG capsule Take 1 capsule (100 mg total) by mouth 2 (two) times daily for 10 days. 20 capsule Trevor Iha, FNP      PDMP not reviewed this encounter.   Trevor Iha, FNP 09/07/22 1006

## 2022-09-10 ENCOUNTER — Ambulatory Visit: Payer: Medicare Other | Admitting: Podiatry

## 2022-09-15 ENCOUNTER — Other Ambulatory Visit (HOSPITAL_BASED_OUTPATIENT_CLINIC_OR_DEPARTMENT_OTHER): Payer: Self-pay | Admitting: Family

## 2022-09-15 DIAGNOSIS — Z1231 Encounter for screening mammogram for malignant neoplasm of breast: Secondary | ICD-10-CM

## 2022-09-16 DIAGNOSIS — M62830 Muscle spasm of back: Secondary | ICD-10-CM | POA: Diagnosis not present

## 2022-09-16 DIAGNOSIS — M9904 Segmental and somatic dysfunction of sacral region: Secondary | ICD-10-CM | POA: Diagnosis not present

## 2022-09-16 DIAGNOSIS — M9905 Segmental and somatic dysfunction of pelvic region: Secondary | ICD-10-CM | POA: Diagnosis not present

## 2022-09-16 DIAGNOSIS — M9903 Segmental and somatic dysfunction of lumbar region: Secondary | ICD-10-CM | POA: Diagnosis not present

## 2022-09-23 ENCOUNTER — Other Ambulatory Visit: Payer: Self-pay | Admitting: Family

## 2022-10-14 ENCOUNTER — Other Ambulatory Visit (HOSPITAL_BASED_OUTPATIENT_CLINIC_OR_DEPARTMENT_OTHER): Payer: Self-pay

## 2022-10-14 DIAGNOSIS — M62830 Muscle spasm of back: Secondary | ICD-10-CM | POA: Diagnosis not present

## 2022-10-14 DIAGNOSIS — M9905 Segmental and somatic dysfunction of pelvic region: Secondary | ICD-10-CM | POA: Diagnosis not present

## 2022-10-14 DIAGNOSIS — M9904 Segmental and somatic dysfunction of sacral region: Secondary | ICD-10-CM | POA: Diagnosis not present

## 2022-10-14 DIAGNOSIS — M9903 Segmental and somatic dysfunction of lumbar region: Secondary | ICD-10-CM | POA: Diagnosis not present

## 2022-10-17 ENCOUNTER — Ambulatory Visit (INDEPENDENT_AMBULATORY_CARE_PROVIDER_SITE_OTHER): Payer: Medicare Other | Admitting: Family

## 2022-10-17 ENCOUNTER — Encounter: Payer: Self-pay | Admitting: Family

## 2022-10-17 VITALS — BP 127/74 | HR 66 | Ht 62.0 in | Wt 163.2 lb

## 2022-10-17 DIAGNOSIS — G4733 Obstructive sleep apnea (adult) (pediatric): Secondary | ICD-10-CM

## 2022-10-17 DIAGNOSIS — S80862A Insect bite (nonvenomous), left lower leg, initial encounter: Secondary | ICD-10-CM | POA: Diagnosis not present

## 2022-10-17 DIAGNOSIS — Z0000 Encounter for general adult medical examination without abnormal findings: Secondary | ICD-10-CM

## 2022-10-17 DIAGNOSIS — J45909 Unspecified asthma, uncomplicated: Secondary | ICD-10-CM | POA: Diagnosis not present

## 2022-10-17 DIAGNOSIS — W57XXXA Bitten or stung by nonvenomous insect and other nonvenomous arthropods, initial encounter: Secondary | ICD-10-CM | POA: Diagnosis not present

## 2022-10-17 DIAGNOSIS — M8580 Other specified disorders of bone density and structure, unspecified site: Secondary | ICD-10-CM

## 2022-10-17 DIAGNOSIS — E782 Mixed hyperlipidemia: Secondary | ICD-10-CM | POA: Diagnosis not present

## 2022-10-17 DIAGNOSIS — Z780 Asymptomatic menopausal state: Secondary | ICD-10-CM

## 2022-10-17 LAB — COMPREHENSIVE METABOLIC PANEL WITH GFR
ALT: 39 U/L — ABNORMAL HIGH (ref 0–35)
AST: 37 U/L (ref 0–37)
Albumin: 4.7 g/dL (ref 3.5–5.2)
Alkaline Phosphatase: 71 U/L (ref 39–117)
BUN: 12 mg/dL (ref 6–23)
CO2: 27 meq/L (ref 19–32)
Calcium: 9.8 mg/dL (ref 8.4–10.5)
Chloride: 101 meq/L (ref 96–112)
Creatinine, Ser: 0.84 mg/dL (ref 0.40–1.20)
GFR: 70.01 mL/min
Glucose, Bld: 105 mg/dL — ABNORMAL HIGH (ref 70–99)
Potassium: 4.1 meq/L (ref 3.5–5.1)
Sodium: 135 meq/L (ref 135–145)
Total Bilirubin: 0.7 mg/dL (ref 0.2–1.2)
Total Protein: 7.6 g/dL (ref 6.0–8.3)

## 2022-10-17 LAB — LIPID PANEL
Cholesterol: 168 mg/dL (ref 0–200)
HDL: 58.6 mg/dL
LDL Cholesterol: 93 mg/dL (ref 0–99)
NonHDL: 109
Total CHOL/HDL Ratio: 3
Triglycerides: 79 mg/dL (ref 0.0–149.0)
VLDL: 15.8 mg/dL (ref 0.0–40.0)

## 2022-10-17 NOTE — Assessment & Plan Note (Addendum)
Continue healthy diet and exercise.  Mammo scheduled, update dexa, colo up to date.

## 2022-10-17 NOTE — Assessment & Plan Note (Signed)
No symptoms of lyme disease but pt would still like testing to be sure.

## 2022-10-17 NOTE — Assessment & Plan Note (Signed)
Doing well on CPAP notes improvement in headaches since she started CPAP.

## 2022-10-17 NOTE — Assessment & Plan Note (Signed)
Stable with rare use of albuterol.  

## 2022-10-17 NOTE — Assessment & Plan Note (Signed)
Update bone density

## 2022-10-17 NOTE — Assessment & Plan Note (Signed)
Tolerating crestor, update lipid panel.

## 2022-10-17 NOTE — Progress Notes (Signed)
Subjective:     Patient ID: Anna Peterson, female    DOB: 1950/09/11, 72 y.o.   MRN: [id number]  Chief Complaint  Patient presents with   Annual Exam    Annual Exam     HPI  Discussed the use of AI scribe software for clinical note transcription with the patient, who gave verbal consent to proceed.  History of Present Illness   The patient presents for an annual physical. She reports adherence to a healthy diet, primarily Mediterranean, with minimal fried food intake. She cooks most meals at home, typically including a fresh salad and a protein such as fish or chicken. Exercise has been less frequent due to weather conditions, but she engages in weight-bearing exercise weekly. She reports almost never needing to use albuterol and has had almost no migraines since starting CPAP and topiramate. She had a tick bite six weeks ago and completed a course of doxycycline. She has no symptoms suggestive of Lyme disease but requests serologic testing due to personal concern. She also reports a postnasal drip, which she attributes to allergies.       Wt Readings from Last 3 Encounters:  10/17/22 163 lb 3.2 oz (74 kg)  09/07/22 164 lb (74.4 kg)  07/02/22 163 lb (73.9 kg)   Immunizations: recommend RSV, covid and flu boosters at pharmayc in fall Diet: healthy- cooks at home, mostly [employer] Exercise: some Colonoscopy: up to date Dexa: due Pap Smear: N/a Mammogram: scheduled Dental: up to date Vision: up to date    There are no preventive care reminders to display for this patient.  Past Medical History:  Diagnosis Date   Allergy    Asthma    History of chicken pox    Hyperlipidemia    Migraine    OSA (obstructive sleep apnea) 11/10/2017   Severe per home study 7/19   Osteopenia    Osteopenia per Dexa Scan    Past Surgical History:  Procedure Laterality Date   CESAREAN SECTION     EYE SURGERY     cataract surgery, vitreous removal 2022   KNEE SURGERY Left 2007    meniscus repair    Family History  Problem Relation Age of Onset   Aneurysm Mother 9       Cerebral age 88   CAD Brother 23   Pancreatic cancer Maternal Grandmother    Arthritis Maternal Grandmother    Stroke Maternal Grandfather 5   Arthritis Maternal Grandfather    Breast cancer Paternal Grandmother 62   Arthritis Paternal Grandmother    Arthritis Paternal Grandfather    CAD Brother 39    Social History   Socioeconomic History   Marital status: Married    Spouse name: Not on file   Number of children: Not on file   Years of education: Not on file   Highest education level: Some college, no degree  Occupational History   Not on file  Tobacco Use   Smoking status: Former    Current packs/day: 0.00    Types: Cigarettes    Quit date: 12/06/1970    Years since quitting: 51.8   Smokeless tobacco: Never  Vaping Use   Vaping status: Never Used  Substance and Sexual Activity   Alcohol use: Yes    Alcohol/week: 5.0 standard drinks of alcohol    Types: 5 Glasses of wine per week   Drug use: No   Sexual activity: Not on file  Other Topics Concern   Not on file  Social History Narrative   Retired Engineer, civil (consulting), 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  Live in 4 different states.  Oldest son Barbara Cower lives locally. Second son Onalee Hua- lives in [place], daughter lives in [place], [place] jonothan lives on [address]   No pets++   Married for 43 years.     Social Determinants of Health   Financial Resource Strain: Low Risk  (07/10/2022)   Overall Financial Resource Strain (CARDIA)    Difficulty of Paying Living Expenses: Not hard at all  Food Insecurity: No Food Insecurity (07/10/2022)   Hunger Vital Sign    Worried About Running Out of Food in the Last Year: Never true    Ran Out of Food in the Last Year: Never true  Transportation  Needs: No Transportation Needs (07/10/2022)   PRAPARE - Administrator, Civil Service (Medical): No    Lack of Transportation (Non-Medical): No  Physical Activity: Insufficiently Active (07/10/2022)   Exercise Vital Sign    Days of Exercise per Week: 3 days    Minutes of Exercise per Session: 30 min  Stress: No Stress Concern Present (07/10/2022)   [employer] of Occupational Health - Occupational Stress Questionnaire    Feeling of Stress : Not at all  Social Connections: Socially Integrated (07/10/2022)   Social Connection and Isolation Panel [NHANES]    Frequency of Communication with Friends and Family: More than three times a week    Frequency of Social Gatherings with Friends and Family: More than three times a week    Attends Religious Services: More than 4 times per year    Active Member of [employer] or Organizations: Yes    Attends Banker Meetings: More than 4 times per year    Marital Status: Married  Catering manager Violence: Not At Risk (01/03/2022)   Humiliation, Afraid, Rape, and Kick questionnaire    Fear of Current or Ex-Partner: No    Emotionally Abused: No    Physically Abused: No    Sexually Abused: No    Outpatient Medications Prior to Visit  Medication Sig Dispense Refill   albuterol (VENTOLIN HFA) 108 (90 Base) MCG/ACT inhaler INHALE 2 PUFFS BY MOUTH EVERY 6 HOURS AS NEEDED FOR WHEEZING 8.5 each 2   Calcium Carbonate-Vitamin D 600-400 MG-UNIT chew tablet Chew 1 tablet by mouth daily.     fluticasone (FLONASE) 50 MCG/ACT nasal spray Place 1 spray into both nostrils daily as needed for allergies or rhinitis.     Multiple Vitamins-Minerals (MULTIVITAL PO) Take 1 tablet by mouth daily. MNS3. (metabolic nutrition system)     Omega-3 Fatty Acids (FISH OIL) 1200 MG CAPS Take 1,200 mg by mouth 2 (two) times daily.     rosuvastatin (CRESTOR) 10 MG tablet TAKE 1 TABLET BY MOUTH EVERY DAY 90 tablet 1   SUMAtriptan (IMITREX) 100 MG tablet Take 1 tablet  (100 mg total) by mouth every 2 (two) hours as needed for migraine. May repeat in 2 hours if headache persists or recurs. 10 tablet 1   topiramate (TOPAMAX) 25 MG tablet Take 25 mg by mouth at bedtime.     Zoster Vaccine Adjuvanted [facility]) injection Inject 0.11ml IM now and again in 2-6  months 0.5 mL 1   Zoster Vaccine Adjuvanted [facility]) injection Inject into the muscle. 0.5  mL 1   mupirocin ointment (BACTROBAN) 2 % Apply 1 Application topically 2 (two) times daily. 22 g 1   No facility-administered medications prior to visit.    Allergies  Allergen Reactions   Cabbage     "Migraine"   Other Swelling    Peaches   Ceclor [Cefaclor] Rash   Latex Rash    Review of Systems  Constitutional:  Negative for weight loss.  HENT:  Negative for congestion and hearing loss.   Eyes:  Negative for blurred vision.  Respiratory:  Negative for cough.   Cardiovascular:  Negative for leg swelling.  Gastrointestinal:  Negative for constipation and diarrhea.  Genitourinary:  Negative for dysuria and frequency.  Musculoskeletal:  Negative for joint pain and myalgias.  Neurological:  Negative for headaches.  Psychiatric/Behavioral:         Denies depression/anxiety       Objective:    Physical Exam   BP 127/74   Pulse 66   Ht 5\' 2"  (1.575 m)   Wt 163 lb 3.2 oz (74 kg)   LMP 04/07/1997   SpO2 100%   BMI 29.85 kg/m  Wt Readings from Last 3 Encounters:  10/17/22 163 lb 3.2 oz (74 kg)  09/07/22 164 lb (74.4 kg)  07/02/22 163 lb (73.9 kg)   Physical Exam  Constitutional: She is oriented to person, place, and time. She appears well-developed and well-nourished. No distress.  HENT:  Head: Normocephalic and atraumatic.  Right Ear: Tympanic membrane and ear canal normal.  Left Ear: Tympanic membrane and ear canal normal.  Mouth/Throat: Oropharynx is clear and moist.  Eyes: Pupils are equal, round, and reactive to light. No scleral icterus.  Neck: Normal range of motion. No  thyromegaly present.  Cardiovascular: Normal rate and regular rhythm.   No murmur heard. Pulmonary/Chest: Effort normal and breath sounds normal. No respiratory distress. He has no wheezes. She has no rales. She exhibits no tenderness.  Abdominal: Soft. Bowel sounds are normal. She exhibits no distension and no mass. There is no tenderness. There is no rebound and no guarding.  Musculoskeletal: She exhibits no edema.  Lymphadenopathy:    She has no cervical adenopathy.  Neurological: She is alert and oriented to person, place, and time. She has 3+ patellar reflexes bilaterally. She exhibits normal muscle tone. Coordination normal.  Skin: Skin is warm and dry.  Psychiatric: She has a normal mood and affect. Her behavior is normal. Judgment and thought content normal.  Breast/pelvic: deferred           Assessment & Plan:       Assessment & Plan:   Problem List Items Addressed This Visit       Unprioritized   Tick bite of left lower leg - Primary    No symptoms of lyme disease but pt would still like testing to be sure.       Relevant Orders   B. burgdorfi antibodies   Preventative health care    Continue healthy diet and exercise.  Mammo scheduled, update dexa, colo up to date.       Osteopenia    Update bone density.       OSA (obstructive sleep apnea)    Doing well on CPAP notes improvement in headaches since she started CPAP.       Hyperlipidemia    Tolerating crestor, update lipid panel.       Relevant Orders   Lipid panel   Comp Met (CMET)   Asthma  Stable with rare use of albuterol      Other Visit Diagnoses     Postmenopausal estrogen deficiency       Relevant Orders   DG Bone Density       I have discontinued Yolotzin Beachem's mupirocin ointment. I am also having her maintain her fluticasone, Multiple Vitamins-Minerals (MULTIVITAL PO), Fish Oil, SUMAtriptan, Calcium Carbonate-Vitamin D, Shingrix, albuterol, Shingrix, rosuvastatin, and  topiramate.  No orders of the defined types were placed in this encounter.

## 2022-10-21 LAB — B. BURGDORFI ANTIBODIES: B burgdorferi Ab IgG+IgM: <0.9 index

## 2022-11-03 DIAGNOSIS — Z85828 Personal history of other malignant neoplasm of skin: Secondary | ICD-10-CM | POA: Diagnosis not present

## 2022-11-03 DIAGNOSIS — D229 Melanocytic nevi, unspecified: Secondary | ICD-10-CM | POA: Diagnosis not present

## 2022-11-03 DIAGNOSIS — I781 Nevus, non-neoplastic: Secondary | ICD-10-CM | POA: Diagnosis not present

## 2022-11-03 DIAGNOSIS — L814 Other melanin hyperpigmentation: Secondary | ICD-10-CM | POA: Diagnosis not present

## 2022-11-11 DIAGNOSIS — M9904 Segmental and somatic dysfunction of sacral region: Secondary | ICD-10-CM | POA: Diagnosis not present

## 2022-11-11 DIAGNOSIS — M9903 Segmental and somatic dysfunction of lumbar region: Secondary | ICD-10-CM | POA: Diagnosis not present

## 2022-11-11 DIAGNOSIS — M9905 Segmental and somatic dysfunction of pelvic region: Secondary | ICD-10-CM | POA: Diagnosis not present

## 2022-11-11 DIAGNOSIS — M62830 Muscle spasm of back: Secondary | ICD-10-CM | POA: Diagnosis not present

## 2022-11-19 DIAGNOSIS — Z1231 Encounter for screening mammogram for malignant neoplasm of breast: Secondary | ICD-10-CM

## 2022-11-24 ENCOUNTER — Ambulatory Visit (HOSPITAL_BASED_OUTPATIENT_CLINIC_OR_DEPARTMENT_OTHER): Admission: RE | Admit: 2022-11-24 | Payer: Medicare Other | Source: Ambulatory Visit

## 2022-11-24 ENCOUNTER — Encounter (HOSPITAL_BASED_OUTPATIENT_CLINIC_OR_DEPARTMENT_OTHER): Payer: Self-pay

## 2022-11-24 ENCOUNTER — Ambulatory Visit (HOSPITAL_BASED_OUTPATIENT_CLINIC_OR_DEPARTMENT_OTHER)
Admission: RE | Admit: 2022-11-24 | Discharge: 2022-11-24 | Disposition: A | Discharge status: Home / Self Care | Payer: Medicare Other | Source: Ambulatory Visit | Attending: Family | Admitting: Family

## 2022-11-24 DIAGNOSIS — Z780 Asymptomatic menopausal state: Secondary | ICD-10-CM

## 2022-11-24 DIAGNOSIS — Z1231 Encounter for screening mammogram for malignant neoplasm of breast: Secondary | ICD-10-CM | POA: Diagnosis not present

## 2022-11-24 DIAGNOSIS — M8588 Other specified disorders of bone density and structure, other site: Secondary | ICD-10-CM | POA: Diagnosis not present

## 2022-12-16 DIAGNOSIS — M9905 Segmental and somatic dysfunction of pelvic region: Secondary | ICD-10-CM | POA: Diagnosis not present

## 2022-12-16 DIAGNOSIS — M9904 Segmental and somatic dysfunction of sacral region: Secondary | ICD-10-CM | POA: Diagnosis not present

## 2022-12-16 DIAGNOSIS — M9903 Segmental and somatic dysfunction of lumbar region: Secondary | ICD-10-CM | POA: Diagnosis not present

## 2022-12-16 DIAGNOSIS — M62830 Muscle spasm of back: Secondary | ICD-10-CM | POA: Diagnosis not present

## 2022-12-23 ENCOUNTER — Other Ambulatory Visit: Payer: Self-pay | Admitting: Family

## 2023-01-05 ENCOUNTER — Ambulatory Visit: Payer: Medicare Other | Admitting: Pulmonary Disease

## 2023-01-05 ENCOUNTER — Encounter: Payer: Self-pay | Admitting: Pulmonary Disease

## 2023-01-05 VITALS — BP 117/79 | HR 77 | Ht 62.0 in | Wt 158.4 lb

## 2023-01-05 DIAGNOSIS — G4733 Obstructive sleep apnea (adult) (pediatric): Secondary | ICD-10-CM

## 2023-01-05 NOTE — Progress Notes (Signed)
Anna Peterson    [id number]    [birth year]  Primary Care Physician:[name], Efraim Kaufmann, NP  Referring Physician: Sandford Craze, NP [address] [place],  [place] [address]  Chief complaint:   Follow-up for obstructive sleep apnea  HPI:  History of severe obstructive sleep apnea Last in the office about 3 years ago Continues to use CPAP nightly  Continues to feel well Wakes up feeling like she is at a good nights rest  Tolerating CPAP okay  Otherwise she is doing well  Health has been relatively stable  She does try to stay active on a regular basis    Outpatient Encounter Medications as of 01/05/2023  Medication Sig   albuterol (VENTOLIN HFA) 108 (90 Base) MCG/ACT inhaler INHALE 2 PUFFS BY MOUTH EVERY 6 HOURS AS NEEDED FOR WHEEZING   Calcium Carbonate-Vitamin D 600-400 MG-UNIT chew tablet Chew 1 tablet by mouth daily.   fluticasone (FLONASE) 50 MCG/ACT nasal spray Place 1 spray into both nostrils daily as needed for allergies or rhinitis.   Multiple Vitamins-Minerals (MULTIVITAL PO) Take 1 tablet by mouth daily. MNS3. (metabolic nutrition system)   Omega-3 Fatty Acids (FISH OIL) 1200 MG CAPS Take 1,200 mg by mouth 2 (two) times daily.   rosuvastatin (CRESTOR) 10 MG tablet TAKE 1 TABLET BY MOUTH EVERY DAY   SUMAtriptan (IMITREX) 100 MG tablet Take 1 tablet (100 mg total) by mouth every 2 (two) hours as needed for migraine. May repeat in 2 hours if headache persists or recurs.   topiramate (TOPAMAX) 25 MG tablet Take 25 mg by mouth at bedtime.   Zoster Vaccine Adjuvanted [facility]) injection Inject 0.31ml IM now and again in 2-6  months   Zoster Vaccine Adjuvanted [facility]) injection Inject into the muscle.   No facility-administered encounter medications on file as of 01/05/2023.    Allergies as of 01/05/2023 - Review Complete 01/05/2023  Allergen Reaction Noted   Cabbage  12/29/2016   Other Swelling 10/03/2016   Ceclor [cefaclor] Rash  12/20/2013   Latex Rash 12/26/2013    Past Medical History:  Diagnosis Date   Allergy    Asthma    History of chicken pox    Hyperlipidemia    Migraine    OSA (obstructive sleep apnea) 11/10/2017   Severe per home study 7/19   Osteopenia    Osteopenia per Dexa Scan    Past Surgical History:  Procedure Laterality Date   CESAREAN SECTION     EYE SURGERY     cataract surgery, vitreous removal 2022   KNEE SURGERY Left 2007   meniscus repair    Family History  Problem Relation Age of Onset   Aneurysm Mother 55       Cerebral age 47   CAD Brother 5   Pancreatic cancer Maternal Grandmother    Arthritis Maternal Grandmother    Stroke Maternal Grandfather 25   Arthritis Maternal Grandfather    Breast cancer Paternal Grandmother 31   Arthritis Paternal Grandmother    Arthritis Paternal Grandfather    CAD Brother 6    Social History   Socioeconomic History   Marital status: Married    Spouse name: Not on file   Number of children: Not on file   Years of education: Not on file   Highest education level: Some college, no degree  Occupational History   Not on file  Tobacco Use   Smoking status: Former    Current packs/day: 0.00  Types: Cigarettes    Quit date: 12/06/1970    Years since quitting: 52.1   Smokeless tobacco: Never  Vaping Use   Vaping status: Never Used  Substance and Sexual Activity   Alcohol use: Yes    Alcohol/week: 5.0 standard drinks of alcohol    Types: 5 Glasses of wine per week   Drug use: No   Sexual activity: Not on file  Other Topics Concern   Not on file  Social History Narrative   Retired Engineer, civil (consulting), 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  Live in 4 different states.  Oldest son Barbara Cower lives locally. Second son Onalee Hua- lives in [place], daughter lives in [place], [place] jonothan lives on  [address]   No pets++   Married for 43 years.     Social Determinants of Health   Financial Resource Strain: Low Risk  (07/10/2022)   Overall Financial Resource Strain (CARDIA)    Difficulty of Paying Living Expenses: Not hard at all  Food Insecurity: No Food Insecurity (07/10/2022)   Hunger Vital Sign    Worried About Running Out of Food in the Last Year: Never true    Ran Out of Food in the Last Year: Never true  Transportation Needs: No Transportation Needs (07/10/2022)   PRAPARE - Administrator, Civil Service (Medical): No    Lack of Transportation (Non-Medical): No  Physical Activity: Insufficiently Active (07/10/2022)   Exercise Vital Sign    Days of Exercise per Week: 3 days    Minutes of Exercise per Session: 30 min  Stress: No Stress Concern Present (07/10/2022)   [employer] of Occupational Health - Occupational Stress Questionnaire    Feeling of Stress : Not at all  Social Connections: Socially Integrated (07/10/2022)   Social Connection and Isolation Panel [NHANES]    Frequency of Communication with Friends and Family: More than three times a week    Frequency of Social Gatherings with Friends and Family: More than three times a week    Attends Religious Services: More than 4 times per year    Active Member of [employer] or Organizations: Yes    Attends Banker Meetings: More than 4 times per year    Marital Status: Married  Catering manager Violence: Not At Risk (01/03/2022)   Humiliation, Afraid, Rape, and Kick questionnaire    Fear of Current or Ex-Partner: No    Emotionally Abused: No    Physically Abused: No    Sexually Abused: No    Review of Systems  Constitutional:  Negative for fatigue.  Respiratory:  Positive for apnea. Negative for shortness of breath.   Psychiatric/Behavioral:  Positive for sleep disturbance.     Vitals:   01/05/23 1350  BP: 117/79  Pulse: 77  SpO2: 96%     Physical Exam Constitutional:      Appearance:  Normal appearance.  HENT:     Head: Normocephalic.  Eyes:     General: No scleral icterus. Cardiovascular:     Rate and Rhythm: Normal rate.     Heart sounds: No murmur heard.    No friction rub.  Pulmonary:     Effort: No respiratory distress.     Breath sounds: No stridor. No wheezing or rhonchi.  Musculoskeletal:     Cervical back: No  rigidity or tenderness.  Neurological:     Mental Status: She is alert.  Psychiatric:        Mood and Affect: Mood normal.    Data Reviewed: Compliance data reviewed showing excellent compliance 100% compliance average use of 7 hours 55 minutes AutoSet 01-2094 percentile pressure of 16.1 AHI of 3.9  Assessment:  Severe obstructive sleep apnea adequately treated with CPAP therapy  She has no significant daytime sleepiness Continues to benefit from CPAP use   Plan/Recommendations: Follow-up a year from now  Continue CPAP nightly  Encouraged to call with significant concerns   Virl Diamond MD [facility] 01/05/2023, 1:58 PM  CC: Sandford Craze, NP

## 2023-01-05 NOTE — Patient Instructions (Signed)
I will see you back a year from now  The download from your machine shows that the machine is working well  Call us with significant concerns  Continue using your CPAP on a nightly basis

## 2023-01-09 ENCOUNTER — Ambulatory Visit (INDEPENDENT_AMBULATORY_CARE_PROVIDER_SITE_OTHER): Payer: Medicare Other | Admitting: *Deleted

## 2023-01-09 DIAGNOSIS — H40002 Preglaucoma, unspecified, left eye: Secondary | ICD-10-CM | POA: Diagnosis not present

## 2023-01-09 DIAGNOSIS — H40052 Ocular hypertension, left eye: Secondary | ICD-10-CM | POA: Diagnosis not present

## 2023-01-09 DIAGNOSIS — H2511 Age-related nuclear cataract, right eye: Secondary | ICD-10-CM | POA: Diagnosis not present

## 2023-01-09 DIAGNOSIS — H43811 Vitreous degeneration, right eye: Secondary | ICD-10-CM | POA: Diagnosis not present

## 2023-01-09 DIAGNOSIS — Z961 Presence of intraocular lens: Secondary | ICD-10-CM | POA: Diagnosis not present

## 2023-01-09 DIAGNOSIS — Z0000 Encounter for general adult medical examination without abnormal findings: Secondary | ICD-10-CM

## 2023-01-09 DIAGNOSIS — H35372 Puckering of macula, left eye: Secondary | ICD-10-CM | POA: Diagnosis not present

## 2023-01-09 NOTE — Progress Notes (Signed)
Subjective:   Anna Peterson is a 72 y.o. female who presents for Medicare Annual (Subsequent) preventive examination.  Visit Complete: Virtual  I connected with  Anna Peterson on 01/09/23 by a audio enabled telemedicine application and verified that I am speaking with the correct person using two identifiers.  Patient Location: Home  Provider Location: Office/Clinic  I discussed the limitations of evaluation and management by telemedicine. The patient expressed understanding and agreed to proceed.   Cardiac Risk Factors include: advanced age (>79men, >74 women);dyslipidemia     Objective:    Because this visit was a virtual/telehealth visit, some criteria may be missing or patient reported. Any vitals not documented were not able to be obtained and vitals that have been documented are patient reported.       01/09/2023    1:11 PM 01/03/2022    1:02 PM 12/21/2017   11:05 PM  Advanced Directives  Does Patient Have a Medical Advance Directive? Yes Yes Yes  Type of Advance Directive Living will;Healthcare Power of [employer] Power of [place];Living will Healthcare Power of [place];Living will  Does patient want to make changes to medical advance directive? No - Patient declined No - Patient declined   Copy of Healthcare Power of Attorney in Chart? No - copy requested No - copy requested No - copy requested    Current Medications (verified) Outpatient Encounter Medications as of 01/09/2023  Medication Sig   albuterol (VENTOLIN HFA) 108 (90 Base) MCG/ACT inhaler INHALE 2 PUFFS BY MOUTH EVERY 6 HOURS AS NEEDED FOR WHEEZING   Calcium Carbonate-Vitamin D 600-400 MG-UNIT chew tablet Chew 1 tablet by mouth daily.   fluticasone (FLONASE) 50 MCG/ACT nasal spray Place 1 spray into both nostrils daily as needed for allergies or rhinitis.   Multiple Vitamins-Minerals (MULTIVITAL PO) Take 1 tablet by mouth daily. MNS3. (metabolic nutrition system)   Omega-3 Fatty Acids (FISH OIL)  1200 MG CAPS Take 1,200 mg by mouth 2 (two) times daily.   rosuvastatin (CRESTOR) 10 MG tablet TAKE 1 TABLET BY MOUTH EVERY DAY   SUMAtriptan (IMITREX) 100 MG tablet Take 1 tablet (100 mg total) by mouth every 2 (two) hours as needed for migraine. May repeat in 2 hours if headache persists or recurs.   topiramate (TOPAMAX) 25 MG tablet Take 25 mg by mouth at bedtime.   [DISCONTINUED] Zoster Vaccine Adjuvanted [facility]) injection Inject 0.13ml IM now and again in 2-6  months   [DISCONTINUED] Zoster Vaccine Adjuvanted [facility] injection Inject into the muscle.   No facility-administered encounter medications on file as of 01/09/2023.    Allergies (verified) Cabbage, Other, Ceclor [cefaclor], and Latex   History: Past Medical History:  Diagnosis Date   Allergy    Asthma    History of chicken pox    Hyperlipidemia    Migraine    OSA (obstructive sleep apnea) 11/10/2017   Severe per home study 7/19   Osteopenia    Osteopenia per Dexa Scan   Past Surgical History:  Procedure Laterality Date   CESAREAN SECTION     EYE SURGERY     cataract surgery, vitreous removal 2022   KNEE SURGERY Left 2007   meniscus repair   Family History  Problem Relation Age of Onset   Aneurysm Mother 59       Cerebral age 63   CAD Brother 5   Pancreatic cancer Maternal Grandmother    Arthritis Maternal Grandmother    Stroke Maternal Grandfather 19   Arthritis Maternal Grandfather  Breast cancer Paternal Grandmother 50   Arthritis Paternal Grandmother    Arthritis Paternal Grandfather    CAD Brother 60   Social History   Socioeconomic History   Marital status: Married    Spouse name: Not on file   Number of children: Not on file   Years of education: Not on file   Highest education level: Some college, no degree  Occupational History   Not on file  Tobacco Use   Smoking status: Former    Current packs/day: 0.00    Types: Cigarettes    Quit date: 12/06/1970    Years since quitting:  52.1   Smokeless tobacco: Never  Vaping Use   Vaping status: Never Used  Substance and Sexual Activity   Alcohol use: Yes    Alcohol/week: 5.0 standard drinks of alcohol    Types: 5 Glasses of wine per week   Drug use: No   Sexual activity: Not on file  Other Topics Concern   Not on file  Social History Narrative   Retired Engineer, civil (consulting), 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  Live in 4 different states.  Oldest son Barbara Cower lives locally. Second son Onalee Hua- lives in [place], daughter lives in [place], [place] jonothan lives on [address]   No pets++   Married for 43 years.     Social Determinants of Health   Financial Resource Strain: Low Risk  (01/09/2023)   Overall Financial Resource Strain (CARDIA)    Difficulty of Paying Living Expenses: Not hard at all  Food Insecurity: No Food Insecurity (01/09/2023)   Hunger Vital Sign    Worried About Running Out of Food in the Last Year: Never true    Ran Out of Food in the Last Year: Never true  Transportation Needs: No Transportation Needs (01/09/2023)   PRAPARE - Administrator, Civil Service (Medical): No    Lack of Transportation (Non-Medical): No  Physical Activity: Insufficiently Active (01/09/2023)   Exercise Vital Sign    Days of Exercise per Week: 7 days    Minutes of Exercise per Session: 20 min  Stress: No Stress Concern Present (01/09/2023)   [employer] of Occupational Health - Occupational Stress Questionnaire    Feeling of Stress : Not at all  Social Connections: Socially Integrated (01/09/2023)   Social Connection and Isolation Panel [NHANES]    Frequency of Communication with Friends and Family: More than three times a week    Frequency of Social Gatherings with Friends and Family: More than three times a week    Attends Religious Services: More than 4 times per  year    Active Member of [employer] or Organizations: Yes    Attends Engineer, structural: More than 4 times per year    Marital Status: Married    Tobacco Counseling Counseling given: Not Answered   Clinical Intake:  Pre-visit preparation completed: Yes  Pain : No/denies pain  BMI - recorded: 28.97 Nutritional Status: BMI 25 -29 Overweight Nutritional Risks: None Diabetes: No  How often do you need to have someone help you when you read instructions, pamphlets, or other written materials from your doctor or pharmacy?: 1 - Never  Interpreter Needed?: No  Information entered by :: Donne Anon, CMA   Activities of Daily Living  01/09/2023    1:02 PM  In your present state of health, do you have any difficulty performing the following activities:  Hearing? 0  Vision? 0  Difficulty concentrating or making decisions? 0  Walking or climbing stairs? 0  Dressing or bathing? 0  Doing errands, shopping? 0  Preparing Food and eating ? N  Using the Toilet? N  In the past six months, have you accidently leaked urine? N  Do you have problems with loss of bowel control? N  Managing your Medications? N  Managing your Finances? N  Housekeeping or managing your Housekeeping? N    Patient Care Team: Sandford Craze, NP as PCP - General (Internal Medicine) Erling Cruz., MD as Referring Physician (Gastroenterology) Mammography, [facility] (Diagnostic Radiology)  Indicate any recent Medical Services you may have received from other than [facility] providers in the past year (date may be approximate).     Assessment:   This is a routine wellness examination for Jailin.  Hearing/Vision screen No results found.   Goals Addressed   None    Depression Screen    01/09/2023    1:10 PM 10/17/2022    9:28 AM 07/02/2022    9:10 AM 01/03/2022    1:06 PM 10/15/2021    9:31 AM 10/12/2020    9:14 AM 10/12/2020    9:09 AM  PHQ 2/9 Scores  PHQ - 2 Score 0 0 0 0 0 0 0    Fall Risk     01/09/2023    1:05 PM 10/17/2022    9:27 AM 07/02/2022    9:10 AM 01/03/2022    1:02 PM 10/15/2021    9:31 AM  Fall Risk   Falls in the past year? 0 0 0 0 0  Number falls in past yr: 0 0 0 0 0  Injury with Fall? 0 0 0 0 0  Risk for fall due to : No Fall Risks No Fall Risks No Fall Risks No Fall Risks   Follow up Falls evaluation completed  Falls evaluation completed Falls evaluation completed Falls evaluation completed    MEDICARE RISK AT HOME: Medicare Risk at Home Any stairs in or around the home?: Yes If so, are there any without handrails?: No Home free of loose throw rugs in walkways, pet beds, electrical cords, etc?: Yes Adequate lighting in your home to reduce risk of falls?: Yes Life alert?: No Use of a cane, walker or w/c?: No Grab bars in the bathroom?: Yes (in the shower) Shower chair or bench in shower?: Yes (built in seat) Elevated toilet seat or a handicapped toilet?: No  TIMED UP AND GO:  Was the test performed?  No    Cognitive Function:        01/09/2023    1:12 PM 01/03/2022    1:17 PM  6CIT Screen  What Year? 0 points 0 points  What month? 0 points 0 points  What time? 0 points 0 points  Count back from 20 0 points 0 points  Months in reverse 0 points 0 points  Repeat phrase 0 points 0 points  Total Score 0 points 0 points    Immunizations Immunization History  Administered Date(s) Administered   PFIZER(Purple Top)SARS-COV-2 Vaccination 05/05/2019, 06/02/2019, 01/10/2020   PNEUMOCOCCAL CONJUGATE-20 11/01/2021   Pneumococcal Polysaccharide-23 10/01/2018   Tdap 05/30/2009, 12/13/2019   Zoster Recombinant(Shingrix) 07/02/2022, 10/15/2022    TDAP status: Up to date  Flu Vaccine status: Declined, Education has been provided regarding the importance of this vaccine  but patient still declined. Advised may receive this vaccine at local pharmacy or Health Dept. Aware to provide a copy of the vaccination record if obtained from local pharmacy or Health  Dept. Verbalized acceptance and understanding.  Pneumococcal vaccine status: Up to date  Covid-19 vaccine status: Information provided on how to obtain vaccines.   Qualifies for Shingles Vaccine? Yes   Zostavax completed No   Shingrix Completed?: Yes  Screening Tests Health Maintenance  Topic Date Due   Medicare Annual Wellness (AWV)  01/04/2023   INFLUENZA VACCINE  07/06/2023 (Originally 11/06/2022)   MAMMOGRAM  11/23/2024   Colonoscopy  01/20/2027   DTaP/Tdap/Td (3 - Td or Tdap) 12/12/2029   Pneumonia Vaccine 69+ Years old  Completed   DEXA SCAN  Completed   Hepatitis C Screening  Completed   Zoster Vaccines- Shingrix  Completed   HPV VACCINES  Aged Out   COVID-19 Vaccine  Discontinued    Health Maintenance  Health Maintenance Due  Topic Date Due   Medicare Annual Wellness (AWV)  01/04/2023    Colorectal cancer screening: Type of screening: Colonoscopy. Completed 01/19/17. Repeat every 10 years  Mammogram status: Completed 11/24/22. Repeat every year  Bone Density status: Completed 11/24/22. Results reflect: Bone density results: OSTEOPENIA. Repeat every 2 years.  Lung Cancer Screening: (Low Dose CT Chest recommended if Age 91-80 years, 20 pack-year currently smoking OR have quit w/in 15years.) does not qualify.   Additional Screening:  Hepatitis C Screening: does qualify; Completed 10/01/18  Vision Screening: Recommended annual ophthalmology exams for early detection of glaucoma and other disorders of the eye. Is the patient up to date with their annual eye exam?  Yes  Who is the provider or what is the name of the office in which the patient attends annual eye exams? Dr. Arnetha Gula If pt is not established with a provider, would they like to be referred to a provider to establish care? No .   Dental Screening: Recommended annual dental exams for proper oral hygiene  Diabetic Foot Exam: N/a  Community Resource Referral / Chronic Care Management: CRR required  this visit?  No   CCM required this visit?  No     Plan:     I have personally reviewed and noted the following in the patient's chart:   Medical and social history Use of alcohol, tobacco or illicit drugs  Current medications and supplements including opioid prescriptions. Patient is not currently taking opioid prescriptions. Functional ability and status Nutritional status Physical activity Advanced directives List of other physicians Hospitalizations, surgeries, and ER visits in previous 12 months Vitals Screenings to include cognitive, depression, and falls Referrals and appointments  In addition, I have reviewed and discussed with patient certain preventive protocols, quality metrics, and best practice recommendations. A written personalized care plan for preventive services as well as general preventive health recommendations were provided to patient.     Donne Anon, CMA   01/09/2023   After Visit Summary: (MyChart) Due to this being a telephonic visit, the after visit summary with patients personalized plan was offered to patient via MyChart   Nurse Notes: None

## 2023-01-09 NOTE — Patient Instructions (Signed)
Anna Peterson , Thank you for taking time to come for your Medicare Wellness Visit. I appreciate your ongoing commitment to your health goals. Please review the following plan we discussed and let me know if I can assist you in the future.   These are the goals we discussed:  Goals   None     This is a list of the screening recommended for you and due dates:  Health Maintenance  Topic Date Due   Flu Shot  07/06/2023*   Medicare Annual Wellness Visit  01/09/2024   Mammogram  11/23/2024   Colon Cancer Screening  01/20/2027   DTaP/Tdap/Td vaccine (3 - Td or Tdap) 12/12/2029   Pneumonia Vaccine  Completed   DEXA scan (bone density measurement)  Completed   Hepatitis C Screening  Completed   Zoster (Shingles) Vaccine  Completed   HPV Vaccine  Aged Out   COVID-19 Vaccine  Discontinued  *Topic was postponed. The date shown is not the original due date.     Next appointment: Follow up in one year for your annual wellness visit.   Preventive Care 15 Years and Older, Female Preventive care refers to lifestyle choices and visits with your health care provider that can promote health and wellness. What does preventive care include? A yearly physical exam. This is also called an annual well check. Dental exams once or twice a year. Routine eye exams. Ask your health care provider how often you should have your eyes checked. Personal lifestyle choices, including: Daily care of your teeth and gums. Regular physical activity. Eating a healthy diet. Avoiding tobacco and drug use. Limiting alcohol use. Practicing safe sex. Taking low-dose aspirin every day. Taking vitamin and mineral supplements as recommended by your health care provider. What happens during an annual well check? The services and screenings done by your health care provider during your annual well check will depend on your age, overall health, lifestyle risk factors, and family history of disease. Counseling  Your health  care provider may ask you questions about your: Alcohol use. Tobacco use. Drug use. Emotional well-being. Home and relationship well-being. Sexual activity. Eating habits. History of falls. Memory and ability to understand (cognition). Work and work Astronomer. Reproductive health. Screening  You may have the following tests or measurements: Height, weight, and BMI. Blood pressure. Lipid and cholesterol levels. These may be checked every 5 years, or more frequently if you are over 61 years old. Skin check. Lung cancer screening. You may have this screening every year starting at age 24 if you have a 30-pack-year history of smoking and currently smoke or have quit within the past 15 years. Fecal occult blood test (FOBT) of the stool. You may have this test every year starting at age 59. Flexible sigmoidoscopy or colonoscopy. You may have a sigmoidoscopy every 5 years or a colonoscopy every 10 years starting at age 66. Hepatitis C blood test. Hepatitis B blood test. Sexually transmitted disease (STD) testing. Diabetes screening. This is done by checking your blood sugar (glucose) after you have not eaten for a while (fasting). You may have this done every 1-3 years. Bone density scan. This is done to screen for osteoporosis. You may have this done starting at age 87. Mammogram. This may be done every 1-2 years. Talk to your health care provider about how often you should have regular mammograms. Talk with your health care provider about your test results, treatment options, and if necessary, the need for more tests. Vaccines  Your  health care provider may recommend certain vaccines, such as: Influenza vaccine. This is recommended every year. Tetanus, diphtheria, and acellular pertussis (Tdap, Td) vaccine. You may need a Td booster every 10 years. Zoster vaccine. You may need this after age 42. Pneumococcal 13-valent conjugate (PCV13) vaccine. One dose is recommended after age  35. Pneumococcal polysaccharide (PPSV23) vaccine. One dose is recommended after age 60. Talk to your health care provider about which screenings and vaccines you need and how often you need them. This information is not intended to replace advice given to you by your health care provider. Make sure you discuss any questions you have with your health care provider. Document Released: 04/20/2015 Document Revised: 12/12/2015 Document Reviewed: 01/23/2015 Elsevier Interactive Patient Education  2017 [employer].  Fall Prevention in the Home Falls can cause injuries. They can happen to people of all ages. There are many things you can do to make your home safe and to help prevent falls. What can I do on the outside of my home? Regularly fix the edges of walkways and driveways and fix any cracks. Remove anything that might make you trip as you walk through a door, such as a raised step or threshold. Trim any bushes or trees on the path to your home. Use bright outdoor lighting. Clear any walking paths of anything that might make someone trip, such as rocks or tools. Regularly check to see if handrails are loose or broken. Make sure that both sides of any steps have handrails. Any raised decks and porches should have guardrails on the edges. Have any leaves, snow, or ice cleared regularly. Use sand or salt on walking paths during winter. Clean up any spills in your garage right away. This includes oil or grease spills. What can I do in the bathroom? Use night lights. Install grab bars by the toilet and in the tub and shower. Do not use towel bars as grab bars. Use non-skid mats or decals in the tub or shower. If you need to sit down in the shower, use a plastic, non-slip stool. Keep the floor dry. Clean up any water that spills on the floor as soon as it happens. Remove soap buildup in the tub or shower regularly. Attach bath mats securely with double-sided non-slip rug tape. Do not have throw  rugs and other things on the floor that can make you trip. What can I do in the bedroom? Use night lights. Make sure that you have a light by your bed that is easy to reach. Do not use any sheets or blankets that are too big for your bed. They should not hang down onto the floor. Have a firm chair that has side arms. You can use this for support while you get dressed. Do not have throw rugs and other things on the floor that can make you trip. What can I do in the kitchen? Clean up any spills right away. Avoid walking on wet floors. Keep items that you use a lot in easy-to-reach places. If you need to reach something above you, use a strong step stool that has a grab bar. Keep electrical cords out of the way. Do not use floor polish or wax that makes floors slippery. If you must use wax, use non-skid floor wax. Do not have throw rugs and other things on the floor that can make you trip. What can I do with my stairs? Do not leave any items on the stairs. Make sure that there are handrails  on both sides of the stairs and use them. Fix handrails that are broken or loose. Make sure that handrails are as long as the stairways. Check any carpeting to make sure that it is firmly attached to the stairs. Fix any carpet that is loose or worn. Avoid having throw rugs at the top or bottom of the stairs. If you do have throw rugs, attach them to the floor with carpet tape. Make sure that you have a light switch at the top of the stairs and the bottom of the stairs. If you do not have them, ask someone to add them for you. What else can I do to help prevent falls? Wear shoes that: Do not have high heels. Have rubber bottoms. Are comfortable and fit you well. Are closed at the toe. Do not wear sandals. If you use a stepladder: Make sure that it is fully opened. Do not climb a closed stepladder. Make sure that both sides of the stepladder are locked into place. Ask someone to hold it for you, if  possible. Clearly mark and make sure that you can see: Any grab bars or handrails. First and last steps. Where the edge of each step is. Use tools that help you move around (mobility aids) if they are needed. These include: Canes. Walkers. Scooters. Crutches. Turn on the lights when you go into a dark area. Replace any light bulbs as soon as they burn out. Set up your furniture so you have a clear path. Avoid moving your furniture around. If any of your floors are uneven, fix them. If there are any pets around you, be aware of where they are. Review your medicines with your doctor. Some medicines can make you feel dizzy. This can increase your chance of falling. Ask your doctor what other things that you can do to help prevent falls. This information is not intended to replace advice given to you by your health care provider. Make sure you discuss any questions you have with your health care provider. Document Released: 01/18/2009 Document Revised: 08/30/2015 Document Reviewed: 04/28/2014 Elsevier Interactive Patient Education  2017 [employer].

## 2023-02-12 NOTE — Progress Notes (Signed)
  Cardiology Office Note:   Date:  02/13/2023  ID:  Anna Peterson, DOB 06-14-1950, MRN [id number] PCP: Sandford Craze, NP  [facility] Providers Cardiologist:  None {  History of Present Illness:   Anna Peterson is a 72 y.o. female who presents for evaluation of an abnormal EKG.  She has a family history of early coronary artery disease.  I saw her in 2021 she had a stress test which was indeterminant.  She had a calcium score of 0.  The cardiovascular symptoms were non anginal.  In 2023 she had an equivocal POET (Plain Old Exercise Treadmill) but her symptoms were non anginal and she was managed medically.  She had a normal echocardiogram.    Since I saw her she has done well. The patient denies any new symptoms such as chest discomfort, neck or arm discomfort. There has been no new shortness of breath, PND or orthopnea. There have been no reported palpitations, presyncope or syncope.  She does get winded slightly walking up an incline but this is unchanged from last year.  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   ROS: As stated in the HPI and negative for all other systems.  Studies Reviewed:    EKG:   EKG Interpretation Date/Time:  Friday February 13 2023 10:49:53 EST Ventricular Rate:  65 PR Interval:  134 QRS Duration:  72 QT Interval:  388 QTC Calculation: 403 R Axis:   -43  Text Interpretation: Normal sinus rhythm RSR' or QR pattern in V1 suggests right ventricular conduction delay Non-specific ST-t changes No significant change since last tracing Confirmed by Rollene Rotunda ([id number]) on 02/13/2023 11:08:06 AM    Risk Assessment/Calculations:              Physical Exam:   VS:  BP 130/84 (BP Location: Left Arm, Patient Position: Sitting, Cuff Size: Normal)   Pulse (!) 51   Ht 5\' 2"  (1.575 m)   Wt 156 lb 6.4 oz (70.9 kg)   LMP 04/07/1997   SpO2 98%   BMI 28.61 kg/m    Wt Readings from Last 3 Encounters:   02/13/23 156 lb 6.4 oz (70.9 kg)  01/05/23 158 lb 6.4 oz (71.8 kg)  10/17/22 163 lb 3.2 oz (74 kg)     GEN: Well nourished, well developed in no acute distress NECK: No JVD; No carotid bruits CARDIAC: RRR, no murmurs, rubs, gallops RESPIRATORY:  Clear to auscultation without rales, wheezing or rhonchi  ABDOMEN: Soft, non-tender, non-distended EXTREMITIES:  No edema; No deformity   ASSESSMENT AND PLAN:   Sleep apnea: This is well-managed with CPAP.  No change in therapy.   Dyslipidemia: Given strong family history she has been treated with a statin.  I will defer to her primary provider.   Abnormal EKG: She has had a mildly abnormal EKG as above and it is unchanged today.  She had an equivocal stress test but I do not see anything that suggests obstructive coronary disease.  Again this year and then to manage her medically with risk reduction and we had a long conversation about this.  I would like her to exercise more.  I gave her specific recommendations around this.      Follow up with me in one year.   Signed, Rollene Rotunda, MD

## 2023-02-13 ENCOUNTER — Ambulatory Visit: Payer: Medicare Other | Attending: Cardiology | Admitting: Cardiology

## 2023-02-13 ENCOUNTER — Encounter: Payer: Self-pay | Admitting: Cardiology

## 2023-02-13 VITALS — BP 130/84 | HR 51 | Ht 62.0 in | Wt 156.4 lb

## 2023-02-13 DIAGNOSIS — R072 Precordial pain: Secondary | ICD-10-CM

## 2023-02-13 DIAGNOSIS — R9431 Abnormal electrocardiogram [ECG] [EKG]: Secondary | ICD-10-CM | POA: Diagnosis not present

## 2023-02-13 DIAGNOSIS — I4510 Unspecified right bundle-branch block: Secondary | ICD-10-CM

## 2023-02-13 NOTE — Patient Instructions (Signed)
    Follow-Up: At [facility], you and your health needs are our priority.  As part of our continuing mission to provide you with exceptional heart care, we have created designated Provider Care Teams.  These Care Teams include your primary Cardiologist (physician) and Advanced Practice Providers (APPs -  Physician Assistants and Nurse Practitioners) who all work together to provide you with the care you need, when you need it.  We recommend signing up for the patient portal called "MyChart".  Sign up information is provided on this After Visit Summary.  MyChart is used to connect with patients for Virtual Visits (Telemedicine).  Patients are able to view lab/test results, encounter notes, upcoming appointments, etc.  Non-urgent messages can be sent to your provider as well.   To learn more about what you can do with MyChart, go to [email or web address].    Your next appointment:   6 month(s)  Provider:   Rollene Rotunda MD

## 2023-03-12 ENCOUNTER — Other Ambulatory Visit: Payer: Self-pay

## 2023-03-12 MED ORDER — ALBUTEROL SULFATE HFA 108 (90 BASE) MCG/ACT IN AERS: INHALATION_SPRAY | RESPIRATORY_TRACT | 2 refills | Status: AC

## 2023-04-06 ENCOUNTER — Ambulatory Visit (INDEPENDENT_AMBULATORY_CARE_PROVIDER_SITE_OTHER): Payer: Medicare Other | Admitting: Physician Assistant

## 2023-04-06 ENCOUNTER — Encounter: Payer: Self-pay | Admitting: Physician Assistant

## 2023-04-06 VITALS — BP 118/76 | HR 92 | Temp 98.0°F | Ht 62.0 in | Wt 157.1 lb

## 2023-04-06 DIAGNOSIS — J0100 Acute maxillary sinusitis, unspecified: Secondary | ICD-10-CM | POA: Diagnosis not present

## 2023-04-06 MED ORDER — AZITHROMYCIN 250 MG PO TABS
ORAL_TABLET | ORAL | 0 refills | Status: AC
Start: 2023-04-06 — End: 2023-04-11

## 2023-04-06 NOTE — Progress Notes (Signed)
Established patient visit   Patient: Anna Peterson   DOB: 1950/08/15   72 y.o. Female  MRN: [id number] Visit Date: 04/06/2023  Today's healthcare provider: Alfredia Ferguson, PA-C   Chief Complaint  Patient presents with   clogged ear    States she has some sinus issues going on, present for a few weeks. Was around granddaughter who had pneumonia.    Subjective    Pt reports several weeks of L ear feeling clogged, sinus pressure, worsening yesterday with headache. She has taken sudafed, claritin, tylenol, flonase.  She was exposed to pneumonia but denies significant cough or SOB   Medications: Outpatient Medications Prior to Visit  Medication Sig   albuterol (VENTOLIN HFA) 108 (90 Base) MCG/ACT inhaler INHALE 2 PUFFS BY MOUTH EVERY 6 HOURS AS NEEDED FOR WHEEZING   Calcium Carbonate-Vitamin D 600-400 MG-UNIT chew tablet Chew 1 tablet by mouth daily.   fluticasone (FLONASE) 50 MCG/ACT nasal spray Place 1 spray into both nostrils daily as needed for allergies or rhinitis.   Multiple Vitamins-Minerals (MULTIVITAL PO) Take 1 tablet by mouth daily. MNS3. (metabolic nutrition system)   Omega-3 Fatty Acids (FISH OIL) 1200 MG CAPS Take 1,200 mg by mouth 2 (two) times daily.   rosuvastatin (CRESTOR) 10 MG tablet TAKE 1 TABLET BY MOUTH EVERY DAY   SUMAtriptan (IMITREX) 100 MG tablet Take 1 tablet (100 mg total) by mouth every 2 (two) hours as needed for migraine. May repeat in 2 hours if headache persists or recurs.   topiramate (TOPAMAX) 25 MG tablet Take 25 mg by mouth at bedtime.   No facility-administered medications prior to visit.    Review of Systems  Constitutional:  Negative for fatigue and fever.  HENT:  Positive for congestion, sinus pressure and sinus pain.   Respiratory:  Negative for cough and shortness of breath.   Cardiovascular:  Negative for chest pain and leg swelling.  Gastrointestinal:  Negative for abdominal pain.  Neurological:  Positive for headaches.  Negative for dizziness.       Objective    BP 118/76   Pulse 92   Temp 98 F (36.7 C) (Oral)   Ht 5\' 2"  (1.575 m)   Wt 157 lb 2 oz (71.3 kg)   LMP 04/07/1997   SpO2 100%   BMI 28.74 kg/m    Physical Exam Constitutional:      General: She is awake.     Appearance: She is well-developed.  HENT:     Head: Normocephalic.     Right Ear: Tympanic membrane normal.     Left Ear: Tympanic membrane normal.  Eyes:     Conjunctiva/sclera: Conjunctivae normal.  Cardiovascular:     Rate and Rhythm: Normal rate and regular rhythm.     Heart sounds: Normal heart sounds.  Pulmonary:     Effort: Pulmonary effort is normal.     Breath sounds: Normal breath sounds.  Skin:    General: Skin is warm.  Neurological:     Mental Status: She is alert and oriented to person, place, and time.  Psychiatric:        Attention and Perception: Attention normal.        Mood and Affect: Mood normal.        Speech: Speech normal.        Behavior: Behavior is cooperative.      No results found for any visits on 04/06/23.  Assessment & Plan    Acute non-recurrent maxillary sinusitis -  Azithromycin; Take 2 tablets on day 1, then 1 tablet daily on days 2 through 5  Dispense: 6 tablet; Refill: 0   Cont claritin, flonase, hydrate, ibuprofen/tylenol prn headache Rx zpack -- pt reports her sinus infections typically respond to this over amoxicillin.  Return if symptoms worsen or fail to improve.       Alfredia Ferguson, PA-C  [facility] at [facility] [phone] (phone) [phone] (fax)  [facility]

## 2023-05-05 ENCOUNTER — Ambulatory Visit: Payer: Self-pay | Admitting: Family

## 2023-05-05 ENCOUNTER — Ambulatory Visit (INDEPENDENT_AMBULATORY_CARE_PROVIDER_SITE_OTHER): Payer: Medicare Other | Admitting: Family

## 2023-05-05 ENCOUNTER — Other Ambulatory Visit (HOSPITAL_BASED_OUTPATIENT_CLINIC_OR_DEPARTMENT_OTHER): Payer: Self-pay

## 2023-05-05 VITALS — BP 135/80 | HR 77 | Temp 99.0°F | Resp 16 | Ht 62.0 in | Wt 159.0 lb

## 2023-05-05 DIAGNOSIS — H6692 Otitis media, unspecified, left ear: Secondary | ICD-10-CM | POA: Diagnosis not present

## 2023-05-05 MED ORDER — AZITHROMYCIN 250 MG PO TABS
ORAL_TABLET | ORAL | 0 refills | Status: AC
  Filled 2023-05-05: qty 6, 5d supply, fill #0

## 2023-05-05 NOTE — Patient Instructions (Signed)
VISIT SUMMARY:  You came in today with upper respiratory symptoms, including a runny nose, sore throat, and productive cough. These symptoms have persisted since the end of last month and have not improved with over-the-counter medications. You recently developed a sore throat and productive cough with yellowish phlegm. You also experienced some ear pressure and mild nosebleeds from frequent blowing. A COVID test was negative.  YOUR PLAN:  -EAR INFECTION: An upper respiratory infection is a viral or bacterial infection affecting the nose, throat, and airways. You will start taking Azithromycin (Z-Pak) 500mg  on day 1, then 250mg  for 4 more days. Continue using Claritin and Flonase for symptom relief.  -POTENTIAL ALLERGIES: Allergies occur when your immune system reacts to a foreign substance. If your runny nose persists, we may consider an allergy evaluation. In the meantime, please continue claritin and flonase.  INSTRUCTIONS:  Please follow the prescribed course of Azithromycin and continue with Claritin and Flonase. If your symptoms do not improve after completing the antibiotic, consider scheduling an allergy evaluation.

## 2023-05-05 NOTE — Assessment & Plan Note (Signed)
New. Has not tolerated doxy in the past, also Pen allergic. Will rx with azithromycin.   Continue claritin/flonase for nasal congestion. Call if symptoms worsen or if symptoms do not improve.

## 2023-05-05 NOTE — Progress Notes (Signed)
Subjective:     Patient ID: Anna Peterson, female    DOB: 04-May-1950, 73 y.o.   MRN: [id number]  Chief Complaint  Patient presents with   Sinus Problem    Complains of sinus congestion a few weeks   Sore Throat    Complains of sore throat 2 days ago   Cough    Complains of productive cough    HPI  Discussed the use of AI scribe software for clinical note transcription with the patient, who gave verbal consent to proceed.  History of Present Illness   The patient presents with upper respiratory symptoms including runny nose, sore throat, and productive cough.  She has been experiencing persistent upper respiratory symptoms since the end of last month, including a runny nose that did not improve with Claritin. She avoided other antihistamines like Zyrtec due to their drying effects and used Flonase as part of her treatment regimen.  In the past week, she developed a sore throat, likely due to postnasal drip, and her cough became productive with yellowish phlegm. She used her inhaler to help break up the cough, which was effective. No fever is present, but she feels tired, particularly after exertion, such as going to the store.  Her nasal discharge is clear, but frequent blowing has caused some epistaxis in her right nostril. No sinus pressure, but she took Sudafed recently, which raised her blood pressure slightly. She experienced laryngitis over the weekend, which has since improved.  She experiences pressure in her left ear, which sometimes hurts when she is walking, sitting, or lying down, but it is not currently painful. She continues to use her CPAP machine at night and reports sleeping well with it.  She recently traveled to [place] for a belated Christmas celebration, where she was exposed to many people, including her grandchildren who had a stomach virus, which she did not contract. She took a COVID test today, which was negative.          There are no preventive care  reminders to display for this patient.  Past Medical History:  Diagnosis Date   Allergy    Asthma    History of chicken pox    Hyperlipidemia    Migraine    OSA (obstructive sleep apnea) 11/10/2017   Severe per home study 7/19   Osteopenia    Osteopenia per Dexa Scan    Past Surgical History:  Procedure Laterality Date   CESAREAN SECTION     EYE SURGERY     cataract surgery, vitreous removal 2022   KNEE SURGERY Left 2007   meniscus repair    Family History  Problem Relation Age of Onset   Aneurysm Mother 76       Cerebral age 97   CAD Brother 61   Pancreatic cancer Maternal Grandmother    Arthritis Maternal Grandmother    Stroke Maternal Grandfather 5   Arthritis Maternal Grandfather    Breast cancer Paternal Grandmother 100   Arthritis Paternal Grandmother    Arthritis Paternal Grandfather    CAD Brother 40    Social History   Socioeconomic History   Marital status: Married    Spouse name: Not on file   Number of children: Not on file   Years of education: Not on file   Highest education level: Some college, no degree  Occupational History   Not on file  Tobacco Use   Smoking status: Former    Current packs/day: 0.00    Types:  Cigarettes    Quit date: 12/06/1970    Years since quitting: 52.4   Smokeless tobacco: Never  Vaping Use   Vaping status: Never Used  Substance and Sexual Activity   Alcohol use: Yes    Alcohol/week: 5.0 standard drinks of alcohol    Types: 5 Glasses of wine per week   Drug use: No   Sexual activity: Not on file  Other Topics Concern   Not on file  Social History Narrative   Retired Engineer, civil (consulting), 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  Live in 4 different states.  Oldest son Barbara Cower lives locally. Second son Onalee Hua- lives in [place], daughter lives in [place], [place] jonothan lives on  [address]   No pets++   Married for 43 years.     Social Drivers of Corporate investment banker Strain: Low Risk  (01/09/2023)   Overall Financial Resource Strain (CARDIA)    Difficulty of Paying Living Expenses: Not hard at all  Food Insecurity: No Food Insecurity (01/09/2023)   Hunger Vital Sign    Worried About Running Out of Food in the Last Year: Never true    Ran Out of Food in the Last Year: Never true  Transportation Needs: No Transportation Needs (01/09/2023)   PRAPARE - Administrator, Civil Service (Medical): No    Lack of Transportation (Non-Medical): No  Physical Activity: Insufficiently Active (01/09/2023)   Exercise Vital Sign    Days of Exercise per Week: 7 days    Minutes of Exercise per Session: 20 min  Stress: No Stress Concern Present (01/09/2023)   [employer] of Occupational Health - Occupational Stress Questionnaire    Feeling of Stress : Not at all  Social Connections: Socially Integrated (01/09/2023)   Social Connection and Isolation Panel [NHANES]    Frequency of Communication with Friends and Family: More than three times a week    Frequency of Social Gatherings with Friends and Family: More than three times a week    Attends Religious Services: More than 4 times per year    Active Member of [employer] or Organizations: Yes    Attends Banker Meetings: More than 4 times per year    Marital Status: Married  Catering manager Violence: Not At Risk (01/09/2023)   Humiliation, Afraid, Rape, and Kick questionnaire    Fear of Current or Ex-Partner: No    Emotionally Abused: No    Physically Abused: No    Sexually Abused: No    Outpatient Medications Prior to Visit  Medication Sig Dispense Refill   albuterol (VENTOLIN HFA) 108 (90 Base) MCG/ACT inhaler INHALE 2 PUFFS BY MOUTH EVERY 6 HOURS AS NEEDED FOR WHEEZING 8.5 each 2   Calcium Carbonate-Vitamin D 600-400 MG-UNIT chew tablet Chew 1 tablet by mouth daily.     fluticasone (FLONASE)  50 MCG/ACT nasal spray Place 1 spray into both nostrils daily as needed for allergies or rhinitis.     Multiple Vitamins-Minerals (MULTIVITAL PO) Take 1 tablet by mouth daily. MNS3. (metabolic nutrition system)     Omega-3 Fatty Acids (FISH OIL) 1200 MG CAPS Take 1,200 mg by mouth 2 (two) times daily.     rosuvastatin (CRESTOR) 10 MG tablet TAKE 1 TABLET BY MOUTH EVERY DAY 90 tablet 1   topiramate (TOPAMAX) 25  MG tablet Take 25 mg by mouth at bedtime.     SUMAtriptan (IMITREX) 100 MG tablet Take 1 tablet (100 mg total) by mouth every 2 (two) hours as needed for migraine. May repeat in 2 hours if headache persists or recurs. 10 tablet 1   No facility-administered medications prior to visit.    Allergies  Allergen Reactions   Cabbage     "Migraine"   Other Swelling    Peaches   Ceclor [Cefaclor] Rash   Latex Rash    ROS    See HPI Objective:    Physical Exam Constitutional:      General: She is not in acute distress.    Appearance: Normal appearance. She is well-developed.  HENT:     Head: Normocephalic and atraumatic.     Comments: Mild erythema of left TM    Right Ear: Tympanic membrane, ear canal and external ear normal.     Left Ear: External ear normal.     Mouth/Throat:     Pharynx: Posterior oropharyngeal erythema (mild) present. No pharyngeal swelling.  Eyes:     General: No scleral icterus. Neck:     Thyroid: No thyromegaly.  Cardiovascular:     Rate and Rhythm: Normal rate and regular rhythm.     Heart sounds: Normal heart sounds. No murmur heard. Pulmonary:     Effort: Pulmonary effort is normal. No respiratory distress.     Breath sounds: Normal breath sounds. No wheezing.  Musculoskeletal:     Cervical back: Neck supple.  Skin:    General: Skin is warm and dry.  Neurological:     Mental Status: She is alert and oriented to person, place, and time.  Psychiatric:        Mood and Affect: Mood normal.        Behavior: Behavior normal.        Thought  Content: Thought content normal.        Judgment: Judgment normal.      BP 135/80 (BP Location: Right Arm, Patient Position: Sitting, Cuff Size: Small)   Pulse 77   Temp 99 F (37.2 C) (Oral)   Resp 16   Ht 5\' 2"  (1.575 m)   Wt 159 lb (72.1 kg)   LMP 04/07/1997   SpO2 100%   BMI 29.08 kg/m  Wt Readings from Last 3 Encounters:  05/05/23 159 lb (72.1 kg)  04/06/23 157 lb 2 oz (71.3 kg)  02/13/23 156 lb 6.4 oz (70.9 kg)       Assessment & Plan:   Problem List Items Addressed This Visit       Unprioritized   Left otitis media - Primary   New. Has not tolerated doxy in the past, also Pen allergic. Will rx with azithromycin.   Continue claritin/flonase for nasal congestion. Call if symptoms worsen or if symptoms do not improve.       Relevant Medications   azithromycin (ZITHROMAX) 250 MG tablet    I have discontinued Ronika Hayton's SUMAtriptan. I am also having her start on azithromycin. Additionally, I am having her maintain her fluticasone, Multiple Vitamins-Minerals (MULTIVITAL PO), Fish Oil, Calcium Carbonate-Vitamin D, topiramate, rosuvastatin, and albuterol.  Meds ordered this encounter  Medications   azithromycin (ZITHROMAX) 250 MG tablet    Sig: Take 2 tablets on day 1, then take 1 tablet daily on days 2 through 5.    Dispense:  6 tablet    Refill:  0    Supervising Provider:   Abner Greenspan,  STACEY A [[id number]]

## 2023-05-05 NOTE — Telephone Encounter (Signed)
Was seen today .

## 2023-05-05 NOTE — Telephone Encounter (Signed)
  Chief Complaint: sinus congestion; earache Symptoms: runny nose, stuffy nose, sore throat Frequency: comes and goes  Disposition: [] ED /[] Urgent Care (no appt availability in office) / [x] Appointment(In office/virtual)/ []  [facility]/ [] Home Care/ [] Refused Recommended Disposition /[] [facility]/ []  Follow-up with PCP Additional Notes: Pt complaining of sinus pain, sore throat, productive cough (yellowish), and earache for over  week. Pt took home covid test and is negative. Pt has been doing OTC meds and symptoms not subsiding. Per protocol, pt to be seen within 24 hours. PCP had 1720 appt today. RN gave care advice and pt verbalized understanding.            Copied from CRM [id number]. Topic: Clinical - Red Word Triage >> May 05, 2023  4:46 PM Thomes Dinning wrote: Red Word that prompted transfer to Nurse Triage: Patient states she is fighting an upper respiratory infection. With cough and phlegm. She has a cough and it's been more than a week. She tested negative for COVID. She is also having pain in her left ear. Reason for Disposition  Earache  Answer Assessment - Initial Assessment Questions 1. LOCATION: "Where does it hurt?"      Ear, throat,  2. ONSET: "When did the sinus pain start?"  (e.g., hours, days)      Over a week ago 3. SEVERITY: "How bad is the pain?"   (Scale 1-10; mild, moderate or severe)   - MILD (1-3): doesn't interfere with normal activities    - MODERATE (4-7): interferes with normal activities (e.g., work or school) or awakens from sleep   - SEVERE (8-10): excruciating pain and patient unable to do any normal activities        mild 4. RECURRENT SYMPTOM: "Have you ever had sinus problems before?" If Yes, ask: "When was the last time?" and "What happened that time?"      Yes, gets easily  5. NASAL CONGESTION: "Is the nose blocked?" If Yes, ask: "Can you open it or must you breathe through your mouth?"     Yes at times  6. NASAL DISCHARGE:  "Do you have discharge from your nose?" If so ask, "What color?"     clear 7. FEVER: "Do you have a fever?" If Yes, ask: "What is it, how was it measured, and when did it start?"      No  8. OTHER SYMPTOMS: "Do you have any other symptoms?" (e.g., sore throat, cough, earache, difficulty breathing)     Pressure in left ear, sore throat, runny nose  Protocols used: Sinus Pain or Congestion-A-AH

## 2023-07-17 DIAGNOSIS — Z961 Presence of intraocular lens: Secondary | ICD-10-CM | POA: Diagnosis not present

## 2023-07-17 DIAGNOSIS — H40002 Preglaucoma, unspecified, left eye: Secondary | ICD-10-CM | POA: Diagnosis not present

## 2023-07-17 DIAGNOSIS — H40052 Ocular hypertension, left eye: Secondary | ICD-10-CM | POA: Diagnosis not present

## 2023-08-14 ENCOUNTER — Telehealth: Payer: Self-pay | Admitting: Pulmonary Disease

## 2023-08-14 NOTE — Telephone Encounter (Signed)
 Cmn received from [facility] for CPAP supplies.

## 2023-08-27 NOTE — Telephone Encounter (Signed)
Received fax again.

## 2023-09-03 ENCOUNTER — Telehealth: Payer: Self-pay | Admitting: Pulmonary Disease

## 2023-09-03 NOTE — Telephone Encounter (Signed)
 Rc'd signed copy of CMN from Dr. Gaynell Keeler. Will fax to [phone]

## 2023-09-07 ENCOUNTER — Other Ambulatory Visit: Payer: Self-pay | Admitting: Family

## 2023-09-10 NOTE — Telephone Encounter (Signed)
 CMN faxed successfully and signed.

## 2023-10-14 ENCOUNTER — Other Ambulatory Visit (HOSPITAL_BASED_OUTPATIENT_CLINIC_OR_DEPARTMENT_OTHER): Payer: Self-pay | Admitting: Family

## 2023-10-14 DIAGNOSIS — Z1231 Encounter for screening mammogram for malignant neoplasm of breast: Secondary | ICD-10-CM

## 2023-10-19 ENCOUNTER — Telehealth: Payer: Self-pay | Admitting: Family

## 2023-10-19 DIAGNOSIS — E782 Mixed hyperlipidemia: Secondary | ICD-10-CM

## 2023-10-19 NOTE — Addendum Note (Signed)
 Addended by: DARYL SETTER on: 10/19/2023 08:37 PM   Modules accepted: Orders

## 2023-10-19 NOTE — Telephone Encounter (Signed)
 Copied from CRM [id number]. Topic: Clinical - Request for Lab/Test Order >> Oct 19, 2023  9:01 AM Larissa RAMAN wrote: Reason for CRM: Patient is wanting to have fasting labs completed this week. Please contact for scheduling

## 2023-10-19 NOTE — Telephone Encounter (Signed)
 Future orders placed

## 2023-10-20 ENCOUNTER — Ambulatory Visit: Payer: Self-pay | Admitting: Family

## 2023-10-20 ENCOUNTER — Other Ambulatory Visit (INDEPENDENT_AMBULATORY_CARE_PROVIDER_SITE_OTHER)

## 2023-10-20 DIAGNOSIS — E782 Mixed hyperlipidemia: Secondary | ICD-10-CM

## 2023-10-20 LAB — COMPREHENSIVE METABOLIC PANEL WITH GFR
ALT: 20 U/L (ref 0–35)
AST: 23 U/L (ref 0–37)
Albumin: 4.8 g/dL (ref 3.5–5.2)
Alkaline Phosphatase: 64 U/L (ref 39–117)
BUN: 12 mg/dL (ref 6–23)
CO2: 29 meq/L (ref 19–32)
Calcium: 10.2 mg/dL (ref 8.4–10.5)
Chloride: 105 meq/L (ref 96–112)
Creatinine, Ser: 0.8 mg/dL (ref 0.40–1.20)
GFR: 73.71 mL/min (ref >60.00)
Glucose, Bld: 98 mg/dL (ref 70–99)
Potassium: 4.8 meq/L (ref 3.5–5.1)
Sodium: 140 meq/L (ref 135–145)
Total Bilirubin: 0.8 mg/dL (ref 0.2–1.2)
Total Protein: 7.3 g/dL (ref 6.0–8.3)

## 2023-10-20 LAB — LIPID PANEL
Cholesterol: 171 mg/dL (ref 0–200)
HDL: 58.9 mg/dL (ref >39.00)
LDL Cholesterol: 98 mg/dL (ref 0–99)
NonHDL: 112.29
Total CHOL/HDL Ratio: 3
Triglycerides: 73 mg/dL (ref 0.0–149.0)
VLDL: 14.6 mg/dL (ref 0.0–40.0)

## 2023-10-20 NOTE — Telephone Encounter (Signed)
Patient scheduled for labs today.

## 2023-10-26 ENCOUNTER — Encounter: Payer: Medicare Other | Admitting: Family

## 2023-10-27 ENCOUNTER — Encounter: Payer: Medicare Other | Admitting: Family

## 2023-11-03 DIAGNOSIS — L603 Nail dystrophy: Secondary | ICD-10-CM | POA: Diagnosis not present

## 2023-11-03 DIAGNOSIS — Z08 Encounter for follow-up examination after completed treatment for malignant neoplasm: Secondary | ICD-10-CM | POA: Diagnosis not present

## 2023-11-03 DIAGNOSIS — L821 Other seborrheic keratosis: Secondary | ICD-10-CM | POA: Diagnosis not present

## 2023-11-03 DIAGNOSIS — D229 Melanocytic nevi, unspecified: Secondary | ICD-10-CM | POA: Diagnosis not present

## 2023-11-03 DIAGNOSIS — L814 Other melanin hyperpigmentation: Secondary | ICD-10-CM | POA: Diagnosis not present

## 2023-11-03 DIAGNOSIS — Z85828 Personal history of other malignant neoplasm of skin: Secondary | ICD-10-CM | POA: Diagnosis not present

## 2023-11-03 DIAGNOSIS — D1801 Hemangioma of skin and subcutaneous tissue: Secondary | ICD-10-CM | POA: Diagnosis not present

## 2023-11-04 ENCOUNTER — Ambulatory Visit (INDEPENDENT_AMBULATORY_CARE_PROVIDER_SITE_OTHER): Admitting: Family

## 2023-11-04 ENCOUNTER — Encounter: Payer: Self-pay | Admitting: Family

## 2023-11-04 VITALS — BP 130/89 | HR 71 | Temp 98.7°F | Resp 16 | Ht 62.0 in | Wt 158.0 lb

## 2023-11-04 DIAGNOSIS — G4733 Obstructive sleep apnea (adult) (pediatric): Secondary | ICD-10-CM | POA: Diagnosis not present

## 2023-11-04 DIAGNOSIS — Z0000 Encounter for general adult medical examination without abnormal findings: Secondary | ICD-10-CM

## 2023-11-04 DIAGNOSIS — J45909 Unspecified asthma, uncomplicated: Secondary | ICD-10-CM

## 2023-11-04 DIAGNOSIS — M8580 Other specified disorders of bone density and structure, unspecified site: Secondary | ICD-10-CM

## 2023-11-04 DIAGNOSIS — E782 Mixed hyperlipidemia: Secondary | ICD-10-CM | POA: Diagnosis not present

## 2023-11-04 DIAGNOSIS — R350 Frequency of micturition: Secondary | ICD-10-CM

## 2023-11-04 LAB — URINALYSIS, ROUTINE W REFLEX MICROSCOPIC
Bilirubin Urine: NEGATIVE
Hgb urine dipstick: NEGATIVE
Ketones, ur: NEGATIVE
Leukocytes,Ua: NEGATIVE
Nitrite: NEGATIVE
RBC / HPF: NONE SEEN (ref 0–?)
Specific Gravity, Urine: 1.01 (ref 1.000–1.030)
Total Protein, Urine: NEGATIVE
Urine Glucose: NEGATIVE
Urobilinogen, UA: 0.2 (ref 0.0–1.0)
WBC, UA: NONE SEEN (ref 0–?)
pH: 8 (ref 5.0–8.0)

## 2023-11-04 MED ORDER — ROSUVASTATIN CALCIUM 10 MG PO TABS
10.0000 mg | ORAL_TABLET | Freq: Every day | ORAL | 2 refills | Status: AC
Start: 2023-11-04 — End: ?

## 2023-11-04 NOTE — Assessment & Plan Note (Signed)
  Routine health maintenance up to date. Immunizations current except RSV vaccine. - Recommend RSV vaccine at pharmacy. - Continue regular exercise and healthy lifestyle. -schedule mammogram.

## 2023-11-04 NOTE — Assessment & Plan Note (Signed)
 Dexa up to date. Continue caltrate.

## 2023-11-04 NOTE — Assessment & Plan Note (Signed)
 Stable on albuterol  prn. Continue same.

## 2023-11-04 NOTE — Assessment & Plan Note (Signed)
 Lab Results  Component Value Date   CHOL 171 10/20/2023   HDL 58.90 10/20/2023   LDLCALC 98 10/20/2023   TRIG 73.0 10/20/2023   CHOLHDL 3 10/20/2023   Maintained on Crestor . Lipids stable.

## 2023-11-04 NOTE — Assessment & Plan Note (Signed)
Doing well on cpap

## 2023-11-04 NOTE — Progress Notes (Signed)
 Subjective:     Patient ID: Anna Peterson, female    DOB: 28-Apr-1950, 73 y.o.   MRN: [id number]  Chief Complaint  Patient presents with   Annual Exam    HPI  Discussed the use of AI scribe software for clinical note transcription with the patient, who gave verbal consent to proceed.  History of Present Illness  Anna Peterson is a 73 year old female who presents for an annual physical exam. She started a liver supplement after borderline liver function tests and has seen improvement, with ALT levels now within the normal range. She abstains from alcohol and avoids Tylenol and ibuprofen.   Her blood pressure is slightly elevated. She follows a low-fat diet, with occasional indulgence during family visits. 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 She experiences urinary frequency, attributing it to four births, and notes a change in urine and stool odor since starting the liver supplement. There is no burning or blood in urine. Migraines have improved with CPAP and Topamax, reducing the need for Tylenol and ibuprofen. She has osteopenia and continues to take Crestor  for cholesterol management, with recent labs showing favorable results. Her asthma is stable, and she uses albuterol  primarily in the morning to manage drainage.   Immunizations: consider RSV at the pharmacy Diet: healthy Exercise: walks, lifts boxes at her volunteer.  Colonoscopy: 2018, due 2028 Dexa: up to date Pap Smear: Mammogram: scheduled Vision: up to date Dental: up to date     There are no preventive care reminders to display for this patient.  Past Medical History:  Diagnosis Date   Allergy    Asthma    History of chicken pox    Hyperlipidemia    Migraine    OSA (obstructive sleep apnea) 11/10/2017   Severe per home study 7/19   Osteopenia    Osteopenia per Dexa Scan    Past Surgical History:  Procedure Laterality Date    CESAREAN SECTION     EYE SURGERY     cataract surgery, vitreous removal 2022   KNEE SURGERY Left 2007   meniscus repair    Family History  Problem Relation Age of Onset   Aneurysm Mother 52       Cerebral age 35   CAD Brother 44   Pancreatic cancer Maternal Grandmother    Arthritis Maternal Grandmother    Stroke Maternal Grandfather 4   Arthritis Maternal Grandfather    Breast cancer Paternal Grandmother 26   Arthritis Paternal Grandmother    Arthritis Paternal Grandfather    CAD Brother 22    Social History   Socioeconomic History   Marital status: Married    Spouse name: Not on file   Number of children: Not on file   Years of education: Not on file   Highest education level: Associate degree: occupational, Scientist, product/process development, or vocational program  Occupational History   Not on file  Tobacco Use   Smoking status: Former    Current packs/day: 0.00    Types: Cigarettes    Quit date: 12/06/1970    Years since quitting: 52.9   Smokeless tobacco: Never  Vaping Use   Vaping status: Never Used  Substance and Sexual Activity   Alcohol use: Yes    Alcohol/week: 5.0 standard drinks of alcohol    Types: 5 Glasses of wine per week    Comment: cut back on wine   Drug use: No  Sexual activity: Not Currently  Other Topics Concern   Not on file  Social History Narrative   Retired Engineer, civil (consulting), 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  Live in 4 different states.  Oldest son Selinda lives locally. Second son Alm- lives in [place] , daughter lives in [place] , Son jonothan lives on [address]   No pets++   Married for 43 years.     Social Drivers of Corporate investment banker Strain: Low Risk  (11/03/2023)   Overall Financial Resource Strain (CARDIA)    Difficulty of Paying Living Expenses: Not hard at all  Food Insecurity: No Food  Insecurity (11/03/2023)   Hunger Vital Sign    Worried About Running Out of Food in the Last Year: Never true    Ran Out of Food in the Last Year: Never true  Transportation Needs: No Transportation Needs (11/03/2023)   PRAPARE - Administrator, Civil Service (Medical): No    Lack of Transportation (Non-Medical): No  Physical Activity: Insufficiently Active (11/03/2023)   Exercise Vital Sign    Days of Exercise per Week: 3 days    Minutes of Exercise per Session: 30 min  Stress: No Stress Concern Present (11/03/2023)   [employer] of Occupational Health - Occupational Stress Questionnaire    Feeling of Stress: Not at all  Social Connections: Socially Integrated (11/03/2023)   Social Connection and Isolation Panel    Frequency of Communication with Friends and Family: More than three times a week    Frequency of Social Gatherings with Friends and Family: More than three times a week    Attends Religious Services: More than 4 times per year    Active Member of [employer] or Organizations: Yes    Attends Engineer, structural: More than 4 times per year    Marital Status: Married  Catering manager Violence: Not At Risk (01/09/2023)   Humiliation, Afraid, Rape, and Kick questionnaire    Fear of Current or Ex-Partner: No    Emotionally Abused: No    Physically Abused: No    Sexually Abused: No    Outpatient Medications Prior to Visit  Medication Sig Dispense Refill   albuterol  (VENTOLIN  HFA) 108 (90 Base) MCG/ACT inhaler INHALE 2 PUFFS BY MOUTH EVERY 6 HOURS AS NEEDED FOR WHEEZING 8.5 each 2   Calcium  Carbonate-Vitamin D  600-400 MG-UNIT chew tablet Chew 1 tablet by mouth daily.     fluticasone (FLONASE) 50 MCG/ACT nasal spray Place 1 spray into both nostrils daily as needed for allergies or rhinitis.     Multiple Vitamins-Minerals (MULTIVITAL PO) Take 1 tablet by mouth daily. MNS3. (metabolic nutrition system)     Omega-3 Fatty Acids (FISH OIL) 1200 MG CAPS Take 1,200 mg  by mouth 2 (two) times daily.     topiramate (TOPAMAX) 25 MG tablet Take 25 mg by mouth at bedtime.     rosuvastatin  (CRESTOR ) 10 MG tablet TAKE 1 TABLET BY MOUTH EVERY DAY 90 tablet 1   No facility-administered medications prior to visit.    Allergies  Allergen Reactions   Cabbage     Migraine   Other Swelling    Peaches   Ceclor [Cefaclor] Rash   Latex Rash    Review of Systems  Constitutional:  Negative for weight loss.  HENT:  Negative for congestion and hearing loss.   Eyes:  Negative for blurred vision.  Respiratory:  Negative for cough.   Cardiovascular:  Negative for leg swelling.  Gastrointestinal:  Negative for constipation and diarrhea.  Genitourinary:  Positive for frequency. Negative for dysuria.  Musculoskeletal:  Negative for joint pain and myalgias.  Skin:  Negative for rash.  Neurological:  Negative for headaches.  Psychiatric/Behavioral:  Negative for depression. The patient is not nervous/anxious.        Objective:    Physical Exam   BP 130/89 (BP Location: Right Arm, Patient Position: Sitting, Cuff Size: Normal)   Pulse 71   Temp 98.7 F (37.1 C) (Oral)   Resp 16   Ht 5' 2 (1.575 m)   Wt 158 lb (71.7 kg)   LMP 04/07/1997   SpO2 100%   BMI 28.90 kg/m  Wt Readings from Last 3 Encounters:  11/04/23 158 lb (71.7 kg)  05/05/23 159 lb (72.1 kg)  04/06/23 157 lb 2 oz (71.3 kg)   Physical Exam  Constitutional: She is oriented to person, place, and time. She appears well-developed and well-nourished. No distress.  HENT:  Head: Normocephalic and atraumatic.  Right Ear: Tympanic membrane and ear canal normal.  Left Ear: Tympanic membrane and ear canal normal.  Mouth/Throat: Oropharynx is clear and moist.  Eyes: Pupils are equal, round, and reactive to light. No scleral icterus.  Neck: Normal range of motion. No thyromegaly present.  Cardiovascular: Normal rate and regular rhythm.   No murmur heard. Pulmonary/Chest: Effort normal and breath  sounds normal. No respiratory distress. He has no wheezes. She has no rales. She exhibits no tenderness.  Abdominal: Soft. Bowel sounds are normal. She exhibits no distension and no mass. There is no tenderness. There is no rebound and no guarding.  Musculoskeletal: She exhibits no edema.  Lymphadenopathy:    She has no cervical adenopathy.  Neurological: She is alert and oriented to person, place, and time. She has normal patellar reflexes. She exhibits normal muscle tone. Coordination normal.  Skin: Skin is warm and dry.  Psychiatric: She has a normal mood and affect. Her behavior is normal. Judgment and thought content normal.  Breast/Pelvic: deferred       Assessment & Plan:       Assessment & Plan:   Problem List Items Addressed This Visit       Unprioritized   Preventative health care    Routine health maintenance up to date. Immunizations current except RSV vaccine. - Recommend RSV vaccine at pharmacy. - Continue regular exercise and healthy lifestyle. -schedule mammogram.      Osteopenia   Dexa up to date. Continue caltrate.       OSA (obstructive sleep apnea)   Doing well on cpap.       Hyperlipidemia   Lab Results  Component Value Date   CHOL 171 10/20/2023   HDL 58.90 10/20/2023   LDLCALC 98 10/20/2023   TRIG 73.0 10/20/2023   CHOLHDL 3 10/20/2023   Maintained on Crestor . Lipids stable.       Relevant Medications   rosuvastatin  (CRESTOR ) 10 MG tablet   Asthma   Stable on albuterol  prn. Continue same.       Other Visit Diagnoses       Urinary frequency    -  Primary   Relevant Orders   Urine Culture   Urinalysis, Routine w reflex microscopic       I have changed Aliah Apollo's rosuvastatin . I am also  having her maintain her fluticasone, Multiple Vitamins-Minerals (MULTIVITAL PO), Fish Oil, Calcium  Carbonate-Vitamin D , topiramate, and albuterol .  Meds ordered this encounter  Medications   rosuvastatin  (CRESTOR ) 10 MG tablet    Sig:  Take 1 tablet (10 mg total) by mouth daily.    Dispense:  90 tablet    Refill:  2    Supervising Provider:   DOMENICA BLACKBIRD A [[id number]]

## 2023-11-05 ENCOUNTER — Ambulatory Visit: Payer: Self-pay | Admitting: Family

## 2023-11-05 LAB — URINE CULTURE
MICRO NUMBER:: 16764823
Result:: NO GROWTH
SPECIMEN QUALITY:: ADEQUATE

## 2023-11-30 ENCOUNTER — Inpatient Hospital Stay (HOSPITAL_BASED_OUTPATIENT_CLINIC_OR_DEPARTMENT_OTHER): Admission: RE | Admit: 2023-11-30 | Source: Ambulatory Visit

## 2023-11-30 ENCOUNTER — Encounter (HOSPITAL_BASED_OUTPATIENT_CLINIC_OR_DEPARTMENT_OTHER): Payer: Self-pay

## 2023-12-01 ENCOUNTER — Ambulatory Visit (HOSPITAL_BASED_OUTPATIENT_CLINIC_OR_DEPARTMENT_OTHER)
Admission: RE | Admit: 2023-12-01 | Discharge: 2023-12-01 | Disposition: A | Discharge status: Home / Self Care | Source: Ambulatory Visit | Attending: Family | Admitting: Family

## 2023-12-01 ENCOUNTER — Encounter (HOSPITAL_BASED_OUTPATIENT_CLINIC_OR_DEPARTMENT_OTHER): Payer: Self-pay

## 2023-12-01 DIAGNOSIS — Z1231 Encounter for screening mammogram for malignant neoplasm of breast: Secondary | ICD-10-CM | POA: Insufficient documentation

## 2023-12-15 ENCOUNTER — Ambulatory Visit: Payer: Self-pay

## 2023-12-15 NOTE — Telephone Encounter (Signed)
 FYI Only or Action Required?: FYI only for provider.  Patient was last seen in primary care on 11/04/2023 by Daryl Setter, NP.  Called Nurse Triage reporting Insect Bite.  Symptoms began several days ago.  Interventions attempted: Nothing.  Symptoms are: unchanged.  Triage Disposition: See Physician Within 24 Hours  Patient/caregiver understands and will follow disposition?: Yes, will follow disposition  Copied from CRM [id number]. Topic: Clinical - Red Word Triage >> Dec 15, 2023  8:13 AM Adelita E wrote: [facility] that prompted transfer to Nurse Triage: Bug bite. Patient stated she has a bug bite to her lower left leg that is inflamed, hot to touch, and dark red. Symptoms started this past Friday. Reason for Disposition  [1] Red or very tender (to touch) area AND [2] started over 24 hours after the bite  Answer Assessment - Initial Assessment Questions 1. TYPE of INSECT: What type of insect was it?      Pt is unsure.  2. ONSET: When did you get bitten?      About 4 days ago 3. LOCATION: Where is the insect bite located?      Lower L leg 4. REDNESS: Is the area red or pink? If Yes, ask: What size is the area of redness? (inches or cm). When did the redness start?     Yes, warm to touch, 3/4 diameter 5. PAIN: Is there any pain? If Yes, ask: How bad is the pain? (Scale 0-10; or none, mild, moderate, severe)     Denies pain 6. ITCHING: Does it itch? If Yes, ask: How bad is the itch?      moderate 7. SWELLING: How big is the swelling? (e.g., inches, cm, or compare to coins)     positive 8. OTHER SYMPTOMS: Do you have any other symptoms?  (e.g., difficulty breathing, fever, hives)     denies  Protocols used: Insect Bite-A-AH

## 2023-12-15 NOTE — Telephone Encounter (Signed)
 Appt scheduled

## 2023-12-16 ENCOUNTER — Ambulatory Visit (INDEPENDENT_AMBULATORY_CARE_PROVIDER_SITE_OTHER): Admitting: Medical

## 2023-12-16 ENCOUNTER — Other Ambulatory Visit (HOSPITAL_BASED_OUTPATIENT_CLINIC_OR_DEPARTMENT_OTHER): Payer: Self-pay

## 2023-12-16 ENCOUNTER — Encounter: Payer: Self-pay | Admitting: Medical

## 2023-12-16 VITALS — BP 122/80 | HR 63 | Temp 97.8°F | Resp 14 | Ht 62.0 in | Wt 160.6 lb

## 2023-12-16 DIAGNOSIS — S80862A Insect bite (nonvenomous), left lower leg, initial encounter: Secondary | ICD-10-CM

## 2023-12-16 DIAGNOSIS — L089 Local infection of the skin and subcutaneous tissue, unspecified: Secondary | ICD-10-CM | POA: Diagnosis not present

## 2023-12-16 DIAGNOSIS — W57XXXA Bitten or stung by nonvenomous insect and other nonvenomous arthropods, initial encounter: Secondary | ICD-10-CM

## 2023-12-16 MED ORDER — AMOXICILLIN-POT CLAVULANATE 875-125 MG PO TABS
1.0000 | ORAL_TABLET | Freq: Two times a day (BID) | ORAL | 0 refills | Status: DC
  Filled 2023-12-16: qty 20, 10d supply, fill #0

## 2023-12-16 NOTE — Progress Notes (Signed)
 Subjective:    Patient ID: Anna Peterson, female    DOB: [birth year], 73 y.o.   MRN: [id number]  HPI Anna Peterson is a 73 year old female who presents with a suspected insect bite on her calf.  She noticed a bump on the lateral aspect of her left calf, initially not noticeable on Saturday but later becoming red and purple. As of this morning, the color has lightened to a pinkish/reddish hue. The bump was very  itchy intially. Now mild itchy and mild painful when pressed. No yellow discharge is present, and no other similar areas are noted on her body.  She has a history of a previous bug bite with cellulitis treated at [facility] urgent care,  She has experienced an overall body rash from doxycycline  but tolerates Augmentin  and amoxicillin  well. She takes Claritin daily for allergies and has not used Benadryl for the itching associated with the current bite.  On discussion no tick seen or was attached,   Review of Systems  Constitutional:  Negative for chills, fatigue and fever.  Respiratory:  Negative for cough, choking, shortness of breath and wheezing.   Cardiovascular:  Negative for chest pain and palpitations.  Gastrointestinal:  Negative for abdominal pain, constipation and rectal pain.  Genitourinary:  Negative for dysuria.  Musculoskeletal:  Negative for back pain, myalgias and neck pain.  Skin:  Negative for rash.  Neurological:  Negative for dizziness, seizures, weakness and headaches.  Hematological:  Negative for adenopathy.  Psychiatric/Behavioral:  Negative for behavioral problems and decreased concentration.    Past Medical History:  Diagnosis Date   Allergy    Asthma    History of chicken pox    Hyperlipidemia    Migraine    OSA (obstructive sleep apnea) 11/10/2017   Severe per home study 7/19   Osteopenia    Osteopenia per Dexa Scan     Social History   Socioeconomic History   Marital status: Married    Spouse name: Not on file   Number of children: Not  on file   Years of education: Not on file   Highest education level: Associate degree: occupational, Scientist, product/process development, or vocational program  Occupational History   Not on file  Tobacco Use   Smoking status: Former    Current packs/day: 0.00    Types: Cigarettes    Quit date: 12/06/1970    Years since quitting: 53.0   Smokeless tobacco: Never  Vaping Use   Vaping status: Never Used  Substance and Sexual Activity   Alcohol use: Yes    Alcohol/week: 5.0 standard drinks of alcohol    Types: 5 Glasses of wine per week    Comment: cut back on wine   Drug use: No   Sexual activity: Not Currently  Other Topics Concern   Not on file  Social History Narrative   Retired Engineer, civil (consulting), 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  Live in 4 different states.  Oldest son Selinda lives locally. Second son Alm- lives in [place] , daughter lives in [place] , Son jonothan lives on [address]   No pets++   Married for 43 years.     Social Drivers of Health   Financial Resource Strain: Low Risk  (11/03/2023)   Overall Financial Resource Strain (CARDIA)    Difficulty of Paying  Living Expenses: Not hard at all  Food Insecurity: No Food Insecurity (11/03/2023)   Hunger Vital Sign    Worried About Running Out of Food in the Last Year: Never true    Ran Out of Food in the Last Year: Never true  Transportation Needs: No Transportation Needs (11/03/2023)   PRAPARE - Administrator, Civil Service (Medical): No    Lack of Transportation (Non-Medical): No  Physical Activity: Insufficiently Active (11/03/2023)   Exercise Vital Sign    Days of Exercise per Week: 3 days    Minutes of Exercise per Session: 30 min  Stress: No Stress Concern Present (11/03/2023)   [employer] of Occupational Health - Occupational Stress Questionnaire    Feeling of Stress: Not  at all  Social Connections: Socially Integrated (11/03/2023)   Social Connection and Isolation Panel    Frequency of Communication with Friends and Family: More than three times a week    Frequency of Social Gatherings with Friends and Family: More than three times a week    Attends Religious Services: More than 4 times per year    Active Member of [employer] or Organizations: Yes    Attends Engineer, structural: More than 4 times per year    Marital Status: Married  Catering manager Violence: Not At Risk (01/09/2023)   Humiliation, Afraid, Rape, and Kick questionnaire    Fear of Current or Ex-Partner: No    Emotionally Abused: No    Physically Abused: No    Sexually Abused: No    Past Surgical History:  Procedure Laterality Date   CESAREAN SECTION     EYE SURGERY     cataract surgery, vitreous removal 2022   KNEE SURGERY Left 2007   meniscus repair    Family History  Problem Relation Age of Onset   Aneurysm Mother 52       Cerebral age 87   CAD Brother 60   Pancreatic cancer Maternal Grandmother    Arthritis Maternal Grandmother    Stroke Maternal Grandfather 45   Arthritis Maternal Grandfather    Breast cancer Paternal Grandmother 12   Arthritis Paternal Grandmother    Arthritis Paternal Grandfather    CAD Brother 34    Allergies  Allergen Reactions   Doxycycline  Rash   Cabbage     Migraine   Other Swelling    Peaches   Ceclor [Cefaclor] Rash   Latex Rash    Current Outpatient Medications on File Prior to Visit  Medication Sig Dispense Refill   albuterol  (VENTOLIN  HFA) 108 (90 Base) MCG/ACT inhaler INHALE 2 PUFFS BY MOUTH EVERY 6 HOURS AS NEEDED FOR WHEEZING 8.5 each 2   Calcium  Carbonate-Vitamin D  600-400 MG-UNIT chew tablet Chew 1 tablet by mouth daily.     fluticasone (FLONASE) 50 MCG/ACT nasal spray Place 1 spray into both nostrils daily as needed for allergies or rhinitis.     Multiple Vitamins-Minerals (MULTIVITAL PO) Take 1 tablet by mouth daily.  MNS3. (metabolic nutrition system)     Omega-3 Fatty Acids (FISH OIL) 1200 MG CAPS Take 1,200 mg by mouth 2 (two) times daily.     rosuvastatin  (CRESTOR ) 10 MG tablet Take 1 tablet (10 mg total) by mouth daily. 90 tablet 2   topiramate (TOPAMAX) 25 MG tablet Take 25 mg by mouth at bedtime.     No current facility-administered medications on file prior to visit.    BP 122/80   Pulse 63   Temp 97.8 F (36.6  C) (Oral)   Resp 14   Ht 5' 2 (1.575 m)   Wt 160 lb 9.6 oz (72.8 kg)   LMP 04/07/1997   SpO2 95%   BMI 29.37 kg/m        Objective:   Physical Exam  General- No acute distress. Pleasant patient. Lungs- Clear, even and unlabored. Heart- regular rate and rhythm. Neurologic- CNII- XII grossly intact.  Left lower ext- lateral calf. 1 cm approximate area mild swollen and pinkish red. Mild tender to palpation and mild indurated. No dc. No fluctuance      Assessment & Plan:   Patient Instructions  Localized allergic reaction following insect bite(per discussion I don't think tick)  butpossible early skin infection from insect bite Localized skin reaction on the lateral calf, likely from an insect bite. Lesion was described at one point red, purple, now pinkish red, itchy, and tender. Differential includes low-level allergic reaction and secondary early infection. Rash with doxycycline . On discussion reassures no reaction of any sort to augmentin /amoxicillin  - Prescribed Augmentin  875 mg BID for 10 days. - Advised Benadryl gel for pruritus PRN. - Monitor for central softening, expansion, or increased tenderness. - Follow up as needed if area persists or worsens    [employer], PA-C

## 2023-12-16 NOTE — Patient Instructions (Signed)
 Localized allergic reaction following insect bite(per discussion I don't think tick)  butpossible early skin infection from insect bite Localized skin reaction on the lateral calf, likely from an insect bite. Lesion was described at one point red, purple, now pinkish red, itchy, and tender. Differential includes low-level allergic reaction and secondary early infection. Rash with doxycycline . On discussion reassures no reaction of any sort to augmentin /amoxicillin  - Prescribed Augmentin  875 mg BID for 10 days. - Advised Benadryl gel for pruritus PRN. - Monitor for central softening, expansion, or increased tenderness. - Follow up as needed if area persists or worsens

## 2024-01-06 ENCOUNTER — Ambulatory Visit (INDEPENDENT_AMBULATORY_CARE_PROVIDER_SITE_OTHER): Admitting: *Deleted

## 2024-01-06 VITALS — Ht 62.0 in | Wt 160.0 lb

## 2024-01-06 DIAGNOSIS — Z0000 Encounter for general adult medical examination without abnormal findings: Secondary | ICD-10-CM | POA: Diagnosis not present

## 2024-01-06 NOTE — Patient Instructions (Signed)
 Ms. Anna Peterson , Thank you for taking time out of your busy schedule to complete your Annual Wellness Visit with me. I enjoyed our conversation and look forward to speaking with you again next year. I, as well as your care team,  appreciate your ongoing commitment to your health goals. Please review the following plan we discussed and let me know if I can assist you in the future. Your Game plan/ To Do List    Congratulations on your decision to make healthy lifestyle changes!    This is the goal you have set for this year:   Not eat after dinner and increase walking to 3-4 days a week and up to 40 min a day  Follow up Visits: Next Medicare AWV with our clinical staff: 01/11/25 9am, telephone    Next Office Visit with your provider: 11/04/24 11am, Melissa [name],NP (please bring a copy of your Advanced Directives to this visit or see below for other ways to send us  a copy)  Clinician Recommendations:  Aim for 30 minutes of exercise or brisk walking, 6-8 glasses of water, and 5 servings of fruits and vegetables each day.       This is a list of the screening recommended for you and due dates:  Health Maintenance  Topic Date Due   Medicare Annual Wellness Visit  01/09/2024   Flu Shot  07/05/2024*   Breast Cancer Screening  11/30/2025   Colon Cancer Screening  01/20/2027   DTaP/Tdap/Td vaccine (3 - Td or Tdap) 12/12/2029   Pneumococcal Vaccine for age over 70  Completed   DEXA scan (bone density measurement)  Completed   Hepatitis C Screening  Completed   Zoster (Shingles) Vaccine  Completed   HPV Vaccine  Aged Out   Meningitis B Vaccine  Aged Out   COVID-19 Vaccine  Discontinued  *Topic was postponed. The date shown is not the original due date.    Advanced directives: (Copy Requested) Please bring a copy of your health care power of attorney and living will to the office to be added to your chart at your convenience. You can mail to [facility] [address]  [place], [place] [address] or email to [email or web address] Advance Care Planning is important because it:  [x]  Makes sure you receive the medical care that is consistent with your values, goals, and preferences  [x]  It provides guidance to your family and loved ones and reduces their decisional burden about whether or not they are making the right decisions based on your wishes.  Follow the link provided in your after visit summary or read over the paperwork we have mailed to you to help you started getting your Advance Directives in place. If you need assistance in completing these, please reach out to us  so that we can help you!  See attachments for Preventive Care and Fall Prevention Tips.

## 2024-01-06 NOTE — Progress Notes (Signed)
 Subjective:   Anna Peterson is a 73 y.o. who presents for a Medicare Wellness preventive visit.  As a reminder, Annual Wellness Visits don't include a physical exam, and some assessments may be limited, especially if this visit is performed virtually. We may recommend an in-person follow-up visit with your provider if needed.  Visit Complete: Virtual I connected with  Velora Horstman on 01/06/24 by a audio enabled telemedicine application and verified that I am speaking with the correct person using two identifiers.  Patient Location: Home  Provider Location: Office/Clinic  I discussed the limitations of evaluation and management by telemedicine. The patient expressed understanding and agreed to proceed.  Vital Signs: Because this visit was a virtual/telehealth visit, some criteria may be missing or patient reported. Any vitals not documented were not able to be obtained and vitals that have been documented are patient reported.  VideoDeclined- This patient declined Librarian, academic. Therefore the visit was completed with audio only.  Persons Participating in Visit: Patient.  AWV Questionnaire: Yes: Patient Medicare AWV questionnaire was completed by the patient on 01/05/24; I have confirmed that all information answered by patient is correct and no changes since this date.  Cardiac Risk Factors include: advanced age (>14men, >80 women);dyslipidemia;Other (see comment), Risk factor comments: OSA, Asthma, LBBBB     Objective:    Today's Vitals   01/06/24 0906  Weight: 160 lb (72.6 kg)  Height: 5' 2 (1.575 m)   Body mass index is 29.26 kg/m.     01/06/2024    9:18 AM 01/09/2023    1:11 PM 01/03/2022    1:02 PM 12/21/2017   11:05 PM  Advanced Directives  Does Patient Have a Medical Advance Directive? Yes Yes Yes Yes   Type of Estate agent of [place];Living will Living will;Healthcare Power of [employer] Power of  [place];Living will Healthcare Power of [place];Living will  Does patient want to make changes to medical advance directive? No - Patient declined No - Patient declined No - Patient declined   Copy of Healthcare Power of Attorney in Chart? No - copy requested No - copy requested No - copy requested No - copy requested      Data saved with a previous flowsheet row definition    Current Medications (verified) Outpatient Encounter Medications as of 01/06/2024  Medication Sig   albuterol  (VENTOLIN  HFA) 108 (90 Base) MCG/ACT inhaler INHALE 2 PUFFS BY MOUTH EVERY 6 HOURS AS NEEDED FOR WHEEZING   Calcium  Carbonate-Vitamin D  600-400 MG-UNIT chew tablet Chew 1 tablet by mouth daily.   fluticasone (FLONASE) 50 MCG/ACT nasal spray Place 1 spray into both nostrils daily as needed for allergies or rhinitis.   Multiple Vitamins-Minerals (MULTIVITAL PO) Take 1 tablet by mouth daily. MNS3. (metabolic nutrition system)   Omega-3 Fatty Acids (FISH OIL) 1200 MG CAPS Take 1,200 mg by mouth 2 (two) times daily.   rosuvastatin  (CRESTOR ) 10 MG tablet Take 1 tablet (10 mg total) by mouth daily.   topiramate (TOPAMAX) 25 MG tablet Take 25 mg by mouth at bedtime.   [DISCONTINUED] amoxicillin -clavulanate (AUGMENTIN ) 875-125 MG tablet Take 1 tablet by mouth 2 (two) times daily.   No facility-administered encounter medications on file as of 01/06/2024.    Allergies (verified) Doxycycline , Cabbage, Other, Ceclor [cefaclor], and Latex   History: Past Medical History:  Diagnosis Date   Allergy    Asthma    History of chicken pox    Hyperlipidemia    Migraine  OSA (obstructive sleep apnea) 11/10/2017   Severe per home study 7/19   Osteopenia    Osteopenia per Dexa Scan   Past Surgical History:  Procedure Laterality Date   CESAREAN SECTION     EYE SURGERY     cataract surgery, vitreous removal 2022   KNEE SURGERY Left 2007   meniscus repair   Family History  Problem Relation Age of Onset   Aneurysm  Mother 16       Cerebral age 49   CAD Brother 46   Pancreatic cancer Maternal Grandmother    Arthritis Maternal Grandmother    Stroke Maternal Grandfather 8   Arthritis Maternal Grandfather    Breast cancer Paternal Grandmother 66   Arthritis Paternal Grandmother    Arthritis Paternal Grandfather    CAD Brother 69   Social History   Socioeconomic History   Marital status: Married    Spouse name: Not on file   Number of children: Not on file   Years of education: Not on file   Highest education level: Associate degree: occupational, Scientist, product/process development, or vocational program  Occupational History   Not on file  Tobacco Use   Smoking status: Former    Current packs/day: 0.00    Types: Cigarettes    Start date: 1960    Quit date: 12/06/1970    Years since quitting: 53.1   Smokeless tobacco: Never  Vaping Use   Vaping status: Never Used  Substance and Sexual Activity   Alcohol use: Yes    Alcohol/week: 3.0 standard drinks of alcohol    Types: 3 Glasses of wine per week   Drug use: No   Sexual activity: Not Currently  Other Topics Concern   Not on file  Social History Narrative   Retired Engineer, civil (consulting), 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  Live in 4 different states.  Oldest son Selinda lives locally. Second son Alm- lives in [place] , daughter lives in [place] , Son jonothan lives on [address]   No pets++   Married for 43 years.     Social Drivers of Corporate investment banker Strain: Low Risk  (01/06/2024)   Overall Financial Resource Strain (CARDIA)    Difficulty of Paying Living Expenses: Not very hard  Food Insecurity: No Food Insecurity (01/06/2024)   Hunger Vital Sign    Worried About Running Out of Food in the Last Year: Never true    Ran Out of Food in the Last Year: Never true  Transportation Needs: No Transportation  Needs (01/06/2024)   PRAPARE - Administrator, Civil Service (Medical): No    Lack of Transportation (Non-Medical): No  Physical Activity: Insufficiently Active (01/06/2024)   Exercise Vital Sign    Days of Exercise per Week: 3 days    Minutes of Exercise per Session: 30 min  Stress: No Stress Concern Present (01/06/2024)   [employer] of Occupational Health - Occupational Stress Questionnaire    Feeling of Stress: Not at all  Social Connections: Socially Integrated (01/06/2024)   Social Connection and Isolation Panel    Frequency of Communication with Friends and Family: More than three times a week    Frequency of Social Gatherings with Friends and Family: More than three times a week    Attends Religious Services: More than 4  times per year    Active Member of Clubs or Organizations: Yes    Attends Banker Meetings: More than 4 times per year    Marital Status: Married    Tobacco Counseling Counseling given: Not Answered    Clinical Intake:  Pre-visit preparation completed: Yes  Pain : No/denies pain     BMI - recorded: 29.26 Nutritional Status: BMI 25 -29 Overweight Nutritional Risks: None Diabetes: No  No results found for: HGBA1C   How often do you need to have someone help you when you read instructions, pamphlets, or other written materials from your doctor or pharmacy?: 1 - Never What is the last grade level you completed in school?: associates degree  Interpreter Needed?: No  Information entered by :: Lolita Libra, CMA(AAMA)   Activities of Daily Living     01/05/2024   10:55 PM 01/09/2023    1:02 PM  In your present state of health, do you have any difficulty performing the following activities:  Hearing? 0 0  Vision? 0 0  Difficulty concentrating or making decisions? 0 0  Walking or climbing stairs? 0 0  Dressing or bathing? 0 0  Doing errands, shopping? 0 0  Preparing Food and eating ? N N  Using the Toilet? N N   In the past six months, have you accidently leaked urine? N N  Do you have problems with loss of bowel control? N N  Managing your Medications? N N  Managing your Finances? N N  Housekeeping or managing your Housekeeping? N N    Patient Care Team: Daryl Setter, NP as PCP - General (Internal Medicine) Ladora Gunnar DEL., MD as Referring Physician (Gastroenterology) Mammography, [facility] (Diagnostic Radiology) Beverlee Modesto GAILS, MD as Referring Physician (Ophthalmology)  I have updated your Care Teams any recent Medical Services you may have received from other providers in the past year.     Assessment:   This is a routine wellness examination for Anneth.  Hearing/Vision screen Hearing Screening - Comments:: Denies hearing difficulties.  Vision Screening - Comments:: Up to date with routine eye exams with Dr Beverlee   Goals Addressed               This Visit's Progress     Patient Stated (pt-stated)        Not eat after dinner and increase walking to 3-4 days a week and up to 40 min a day       Depression Screen     01/06/2024    9:17 AM 12/16/2023    8:42 AM 01/09/2023    1:10 PM 10/17/2022    9:28 AM 07/02/2022    9:10 AM 01/03/2022    1:06 PM 10/15/2021    9:31 AM  PHQ 2/9 Scores  PHQ - 2 Score 0 0 0 0 0 0 0  PHQ- 9 Score 0 0         Fall Risk     01/05/2024   10:55 PM 12/16/2023    8:42 AM 01/09/2023    1:05 PM 10/17/2022    9:27 AM 07/02/2022    9:10 AM  Fall Risk   Falls in the past year? 0 0 0 0 0  Number falls in past yr: 0 0 0 0 0  Injury with Fall? 0 0 0 0 0  Risk for fall due to : No Fall Risks No Fall Risks No Fall Risks No Fall Risks No Fall Risks  Follow up Education provided Falls  evaluation completed Falls evaluation completed  Falls evaluation completed    MEDICARE RISK AT HOME:  Medicare Risk at Home Any stairs in or around the home?: (Patient-Rptd) Yes If so, are there any without handrails?: (Patient-Rptd) No Home free of loose  throw rugs in walkways, pet beds, electrical cords, etc?: (Patient-Rptd) Yes Adequate lighting in your home to reduce risk of falls?: (Patient-Rptd) Yes Life alert?: (Patient-Rptd) No Use of a cane, walker or w/c?: (Patient-Rptd) No Grab bars in the bathroom?: (Patient-Rptd) Yes Shower chair or bench in shower?: (Patient-Rptd) No Elevated toilet seat or a handicapped toilet?: (Patient-Rptd) No  TIMED UP AND GO:  Was the test performed?  No,audio  Cognitive Function: 6CIT completed        01/06/2024    9:19 AM 01/09/2023    1:12 PM 01/03/2022    1:17 PM  6CIT Screen  What Year? 0 points 0 points 0 points  What month? 0 points 0 points 0 points  What time? 0 points 0 points 0 points  Count back from 20 0 points 0 points 0 points  Months in reverse 0 points 0 points 0 points  Repeat phrase 0 points 0 points 0 points  Total Score 0 points 0 points 0 points    Immunizations Immunization History  Administered Date(s) Administered   PFIZER(Purple Top)SARS-COV-2 Vaccination 05/05/2019, 06/02/2019, 01/10/2020   PNEUMOCOCCAL CONJUGATE-20 11/01/2021   Pneumococcal Polysaccharide-23 10/01/2018   Tdap 05/30/2009, 12/13/2019   Zoster Recombinant(Shingrix ) 07/02/2022, 10/15/2022    Screening Tests Health Maintenance  Topic Date Due   Medicare Annual Wellness (AWV)  01/09/2024   Influenza Vaccine  07/05/2024 (Originally 11/06/2023)   Mammogram  11/30/2025   Colonoscopy  01/20/2027   DTaP/Tdap/Td (3 - Td or Tdap) 12/12/2029   Pneumococcal Vaccine: 50+ Years  Completed   DEXA SCAN  Completed   Hepatitis C Screening  Completed   Zoster Vaccines- Shingrix   Completed   HPV VACCINES  Aged Out   Meningococcal B Vaccine  Aged Out   COVID-19 Vaccine  Discontinued    Health Maintenance Items Addressed: Declines flu vaccine  Additional Screening:  Vision Screening: Recommended annual ophthalmology exams for early detection of glaucoma and other disorders of the eye. Is the patient up  to date with their annual eye exam?  Yes  Who is the provider or what is the name of the office in which the patient attends annual eye exams? Dr Radionchenko  Dental Screening: Recommended annual dental exams for proper oral hygiene  Community Resource Referral / Chronic Care Management: CRR required this visit?  No   CCM required this visit?  No   Plan:    I have personally reviewed and noted the following in the patient's chart:   Medical and social history Use of alcohol, tobacco or illicit drugs  Current medications and supplements including opioid prescriptions. Patient is not currently taking opioid prescriptions. Functional ability and status Nutritional status Physical activity Advanced directives List of other physicians Hospitalizations, surgeries, and ER visits in previous 12 months Vitals Screenings to include cognitive, depression, and falls Referrals and appointments  In addition, I have reviewed and discussed with patient certain preventive protocols, quality metrics, and best practice recommendations. A written personalized care plan for preventive services as well as general preventive health recommendations were provided to patient.   Lolita Libra, CMA   01/06/2024   After Visit Summary: (MyChart) Due to this being a telephonic visit, the after visit summary with patients personalized plan was offered to  patient via MyChart   Notes: Nothing significant to report at this time.

## 2024-01-20 ENCOUNTER — Ambulatory Visit: Admitting: Pulmonary Disease

## 2024-01-20 ENCOUNTER — Encounter: Payer: Self-pay | Admitting: Pulmonary Disease

## 2024-01-20 VITALS — BP 118/64 | HR 65 | Temp 98.3°F | Ht 62.0 in | Wt 160.6 lb

## 2024-01-20 DIAGNOSIS — G4733 Obstructive sleep apnea (adult) (pediatric): Secondary | ICD-10-CM | POA: Diagnosis not present

## 2024-01-20 NOTE — Patient Instructions (Signed)
 Follow-up a year from now  Continue using your CPAP on a nightly basis  Call us  with significant concerns  Continue to stay active  Download from your machine shows it is working well

## 2024-01-20 NOTE — Progress Notes (Signed)
 Anna Peterson    [id number]    [birth year]  Primary Care Physician:[name], Eleanor, NP  Referring Physician: Daryl Eleanor, NP [address] [place],  [place] [address]  Chief complaint:   In for follow-up for obstructive sleep apnea  HPI:  Severe obstructive sleep apnea Compliant with CPAP  Continues to benefit from CPAP Does not like using the machine but tries to use it nightly  Continues to feel well Stays active  Health has been relatively stable  She does try to stay active on a regular basis   Outpatient Encounter Medications as of 01/20/2024  Medication Sig   albuterol  (VENTOLIN  HFA) 108 (90 Base) MCG/ACT inhaler INHALE 2 PUFFS BY MOUTH EVERY 6 HOURS AS NEEDED FOR WHEEZING   Calcium  Carbonate-Vitamin D  600-400 MG-UNIT chew tablet Chew 1 tablet by mouth daily.   fluticasone (FLONASE) 50 MCG/ACT nasal spray Place 1 spray into both nostrils daily as needed for allergies or rhinitis.   Multiple Vitamins-Minerals (MULTIVITAL PO) Take 1 tablet by mouth daily. MNS3. (metabolic nutrition system)   Omega-3 Fatty Acids (FISH OIL) 1200 MG CAPS Take 1,200 mg by mouth 2 (two) times daily.   rosuvastatin  (CRESTOR ) 10 MG tablet Take 1 tablet (10 mg total) by mouth daily.   topiramate (TOPAMAX) 25 MG tablet Take 25 mg by mouth at bedtime.   No facility-administered encounter medications on file as of 01/20/2024.    Allergies as of 01/20/2024 - Review Complete 01/06/2024  Allergen Reaction Noted   Doxycycline  Rash 12/16/2023   Cabbage  12/29/2016   Other Swelling 10/03/2016   Ceclor [cefaclor] Rash 12/20/2013   Latex Rash 12/26/2013    Past Medical History:  Diagnosis Date   Allergy    Asthma    History of chicken pox    Hyperlipidemia    Migraine    OSA (obstructive sleep apnea) 11/10/2017   Severe per home study 7/19   Osteopenia    Osteopenia per Dexa Scan    Past Surgical History:  Procedure Laterality Date   CESAREAN  SECTION     EYE SURGERY     cataract surgery, vitreous removal 2022   KNEE SURGERY Left 2007   meniscus repair    Family History  Problem Relation Age of Onset   Aneurysm Mother 34       Cerebral age 26   CAD Brother 24   Pancreatic cancer Maternal Grandmother    Arthritis Maternal Grandmother    Stroke Maternal Grandfather 33   Arthritis Maternal Grandfather    Breast cancer Paternal Grandmother 68   Arthritis Paternal Grandmother    Arthritis Paternal Grandfather    CAD Brother 27    Social History   Socioeconomic History   Marital status: Married    Spouse name: Not on file   Number of children: Not on file   Years of education: Not on file   Highest education level: Associate degree: occupational, Scientist, product/process development, or vocational program  Occupational History   Not on file  Tobacco Use   Smoking status: Former    Current packs/day: 0.00    Types: Cigarettes    Start date: 1960    Quit date: 12/06/1970    Years since quitting: 53.1   Smokeless tobacco: Never  Vaping Use   Vaping status: Never Used  Substance and Sexual Activity   Alcohol use: Yes    Alcohol/week: 3.0 standard drinks of alcohol    Types: 3 Glasses  of wine per week   Drug use: No   Sexual activity: Not Currently  Other Topics Concern   Not on file  Social History Narrative   Retired Engineer, civil (consulting), 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  Live in 4 different states.  Oldest son Anna Peterson lives locally. Second son Alm- lives in [place] , daughter lives in [place] , Son Anna Peterson lives on [address]   No pets++   Married for 43 years.     Social Drivers of Corporate investment banker Strain: Low Risk  (01/06/2024)   Overall Financial Resource Strain (CARDIA)    Difficulty of Paying Living Expenses: Not very hard  Food Insecurity: No Food Insecurity (01/06/2024)    Hunger Vital Sign    Worried About Running Out of Food in the Last Year: Never true    Ran Out of Food in the Last Year: Never true  Transportation Needs: No Transportation Needs (01/06/2024)   PRAPARE - Administrator, Civil Service (Medical): No    Lack of Transportation (Non-Medical): No  Physical Activity: Insufficiently Active (01/06/2024)   Exercise Vital Sign    Days of Exercise per Week: 3 days    Minutes of Exercise per Session: 30 min  Stress: No Stress Concern Present (01/06/2024)   [employer] of Occupational Health - Occupational Stress Questionnaire    Feeling of Stress: Not at all  Social Connections: Socially Integrated (01/06/2024)   Social Connection and Isolation Panel    Frequency of Communication with Friends and Family: More than three times a week    Frequency of Social Gatherings with Friends and Family: More than three times a week    Attends Religious Services: More than 4 times per year    Active Member of [employer] or Organizations: Yes    Attends Engineer, structural: More than 4 times per year    Marital Status: Married  Catering manager Violence: Not At Risk (01/06/2024)   Humiliation, Afraid, Rape, and Kick questionnaire    Fear of Current or Ex-Partner: No    Emotionally Abused: No    Physically Abused: No    Sexually Abused: No    Review of Systems  Constitutional:  Negative for fatigue.  Respiratory:  Positive for apnea. Negative for shortness of breath.   Psychiatric/Behavioral:  Positive for sleep disturbance.     There were no vitals filed for this visit.    Physical Exam Constitutional:      Appearance: Normal appearance.  HENT:     Head: Normocephalic.     Mouth/Throat:     Mouth: Mucous membranes are moist.  Eyes:     General: No scleral icterus. Cardiovascular:     Rate and Rhythm: Normal rate.     Heart sounds: No murmur heard.    No friction rub.  Pulmonary:     Effort: No respiratory distress.      Breath sounds: No stridor. No wheezing or rhonchi.  Musculoskeletal:     Cervical back: No rigidity or tenderness.  Neurological:     Mental Status: She is alert.  Psychiatric:        Mood and Affect: Mood normal.    Data Reviewed: Compliance data reviewed showing excellent compliance 100% compliance Average use of 7 hours 45 minutes AutoSet 5-20 Residual  AHI of 4.2  Assessment:  Severe obstructive sleep apnea - Adequately treated with CPAP therapy - Continues to benefit from CPAP use   No significant ongoing issues   Plan/Recommendations: Will maintain a yearly follow-up  Encouraged to call with significant concerns  Continue using CPAP nightly  Jennet Epley MD [facility] 01/20/2024, 3:19 PM  CC: Anna Setter, NP

## 2024-01-22 DIAGNOSIS — H40003 Preglaucoma, unspecified, bilateral: Secondary | ICD-10-CM | POA: Diagnosis not present

## 2024-01-22 DIAGNOSIS — H43811 Vitreous degeneration, right eye: Secondary | ICD-10-CM | POA: Diagnosis not present

## 2024-01-22 DIAGNOSIS — H40052 Ocular hypertension, left eye: Secondary | ICD-10-CM | POA: Diagnosis not present

## 2024-01-22 DIAGNOSIS — Z961 Presence of intraocular lens: Secondary | ICD-10-CM | POA: Diagnosis not present

## 2024-01-22 DIAGNOSIS — H2511 Age-related nuclear cataract, right eye: Secondary | ICD-10-CM | POA: Diagnosis not present

## 2024-01-22 DIAGNOSIS — H35372 Puckering of macula, left eye: Secondary | ICD-10-CM | POA: Diagnosis not present

## 2024-11-04 ENCOUNTER — Encounter: Admitting: Family

## 2025-01-11 ENCOUNTER — Ambulatory Visit
